# Patient Record
Sex: Female | Born: 1965 | Race: White | Hispanic: No | Marital: Single | Smoking: Current every day smoker
Health system: Southern US, Community
[De-identification: ages and names within clinical notes are randomized; demographics above are authoritative.]

## PROBLEM LIST (undated history)

## (undated) HISTORY — PX: BILATERAL CARPAL TUNNEL RELEASE: SHX6508

---

## 2006-01-04 ENCOUNTER — Emergency Department: Payer: Self-pay | Admitting: Emergency Medicine

## 2006-01-04 IMAGING — CR RIGHT FOOT COMPLETE - 3+ VIEW
1 series · 3 of 3 positions shown · non-contrast
Comparison: none

REASON FOR EXAM: Injury
COMMENTS:

PROCEDURE:     DXR - DXR FOOT RT COMPLETE W/OBLIQUES  - January 04, 2006  [DATE]
RESULT:     Three views reveal no fractures or dislocations. The joint
spaces are intact.

[Series 1: view not recorded · 0.17mm/px · 3 of 3 slices shown]
[im 1/3]
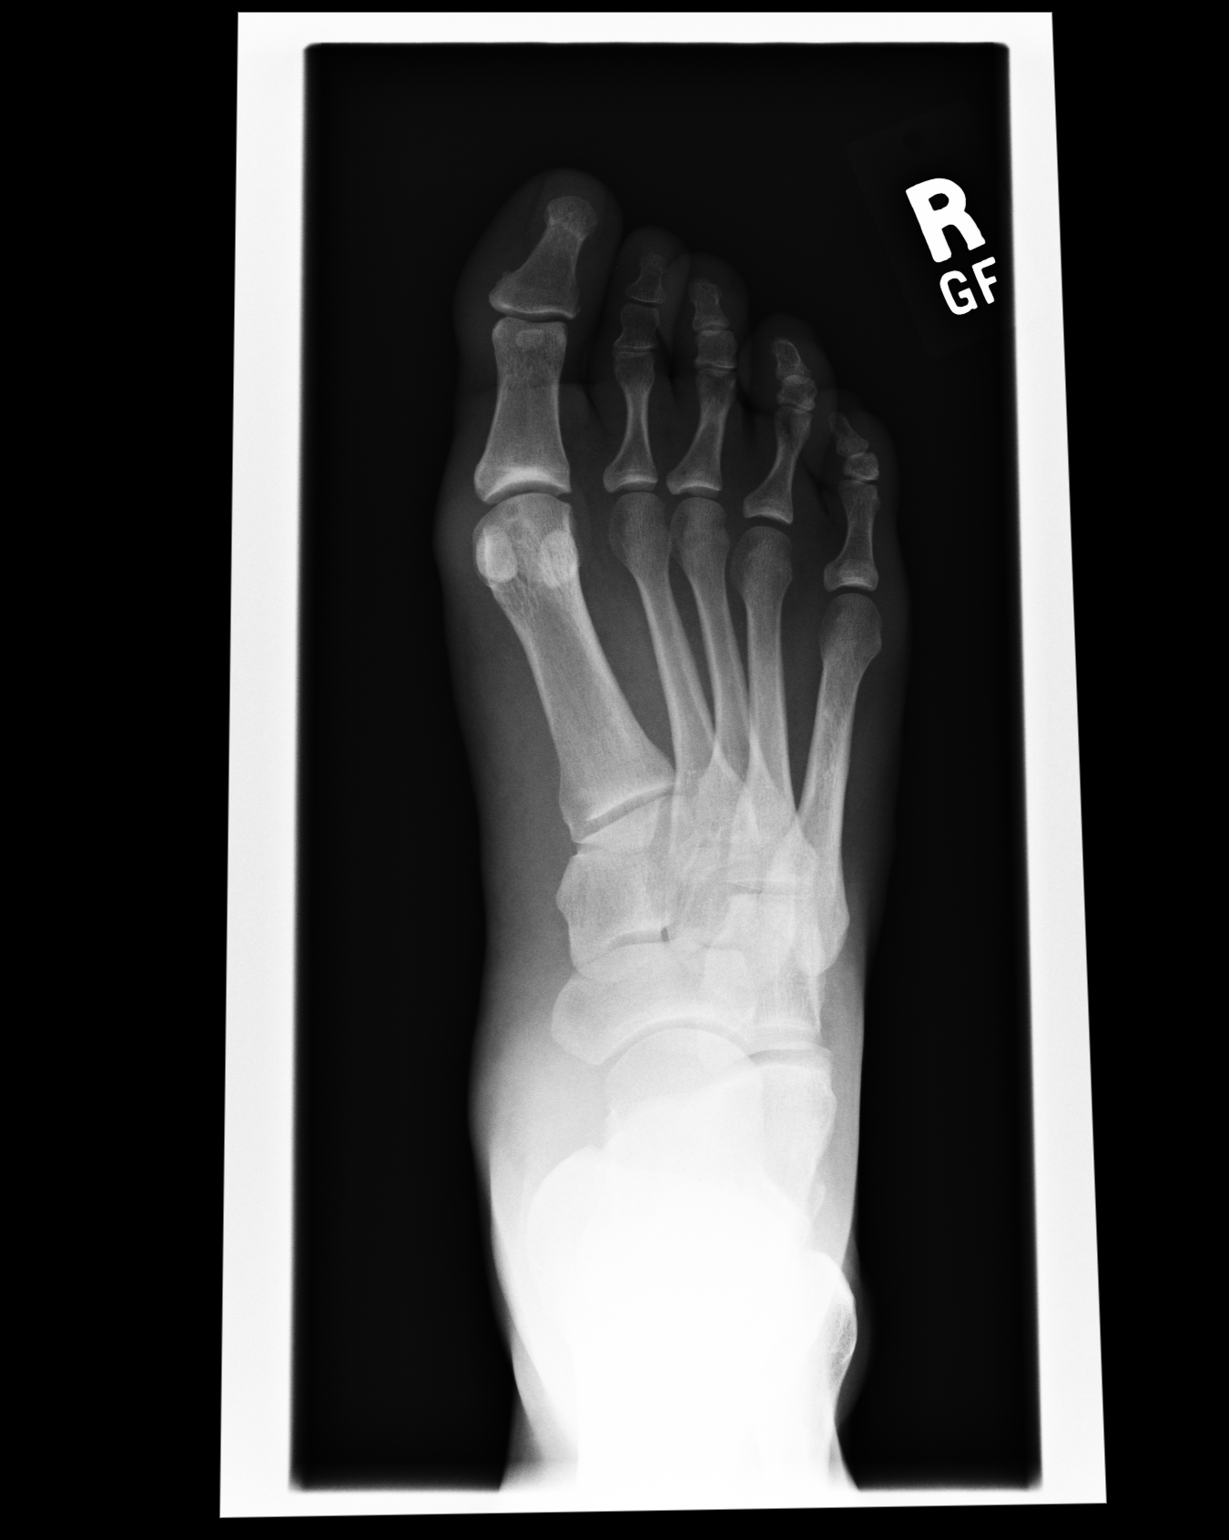
[im 2/3]
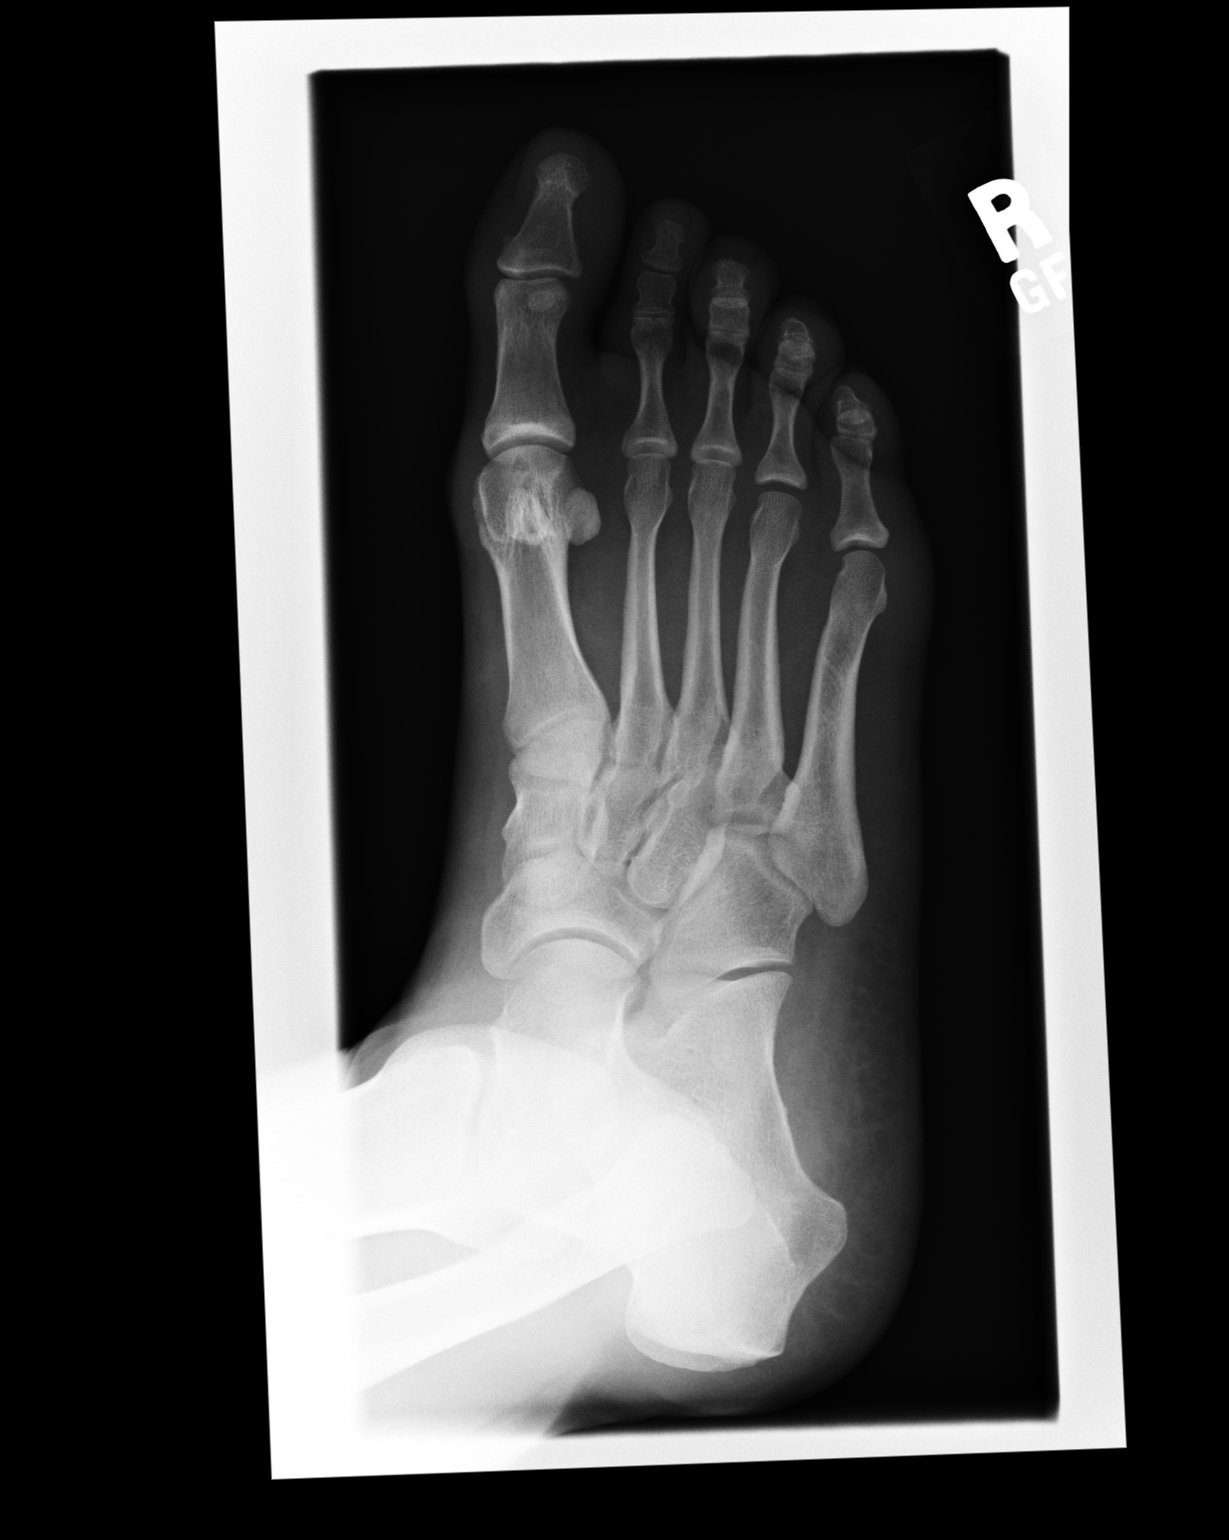
[im 3/3]
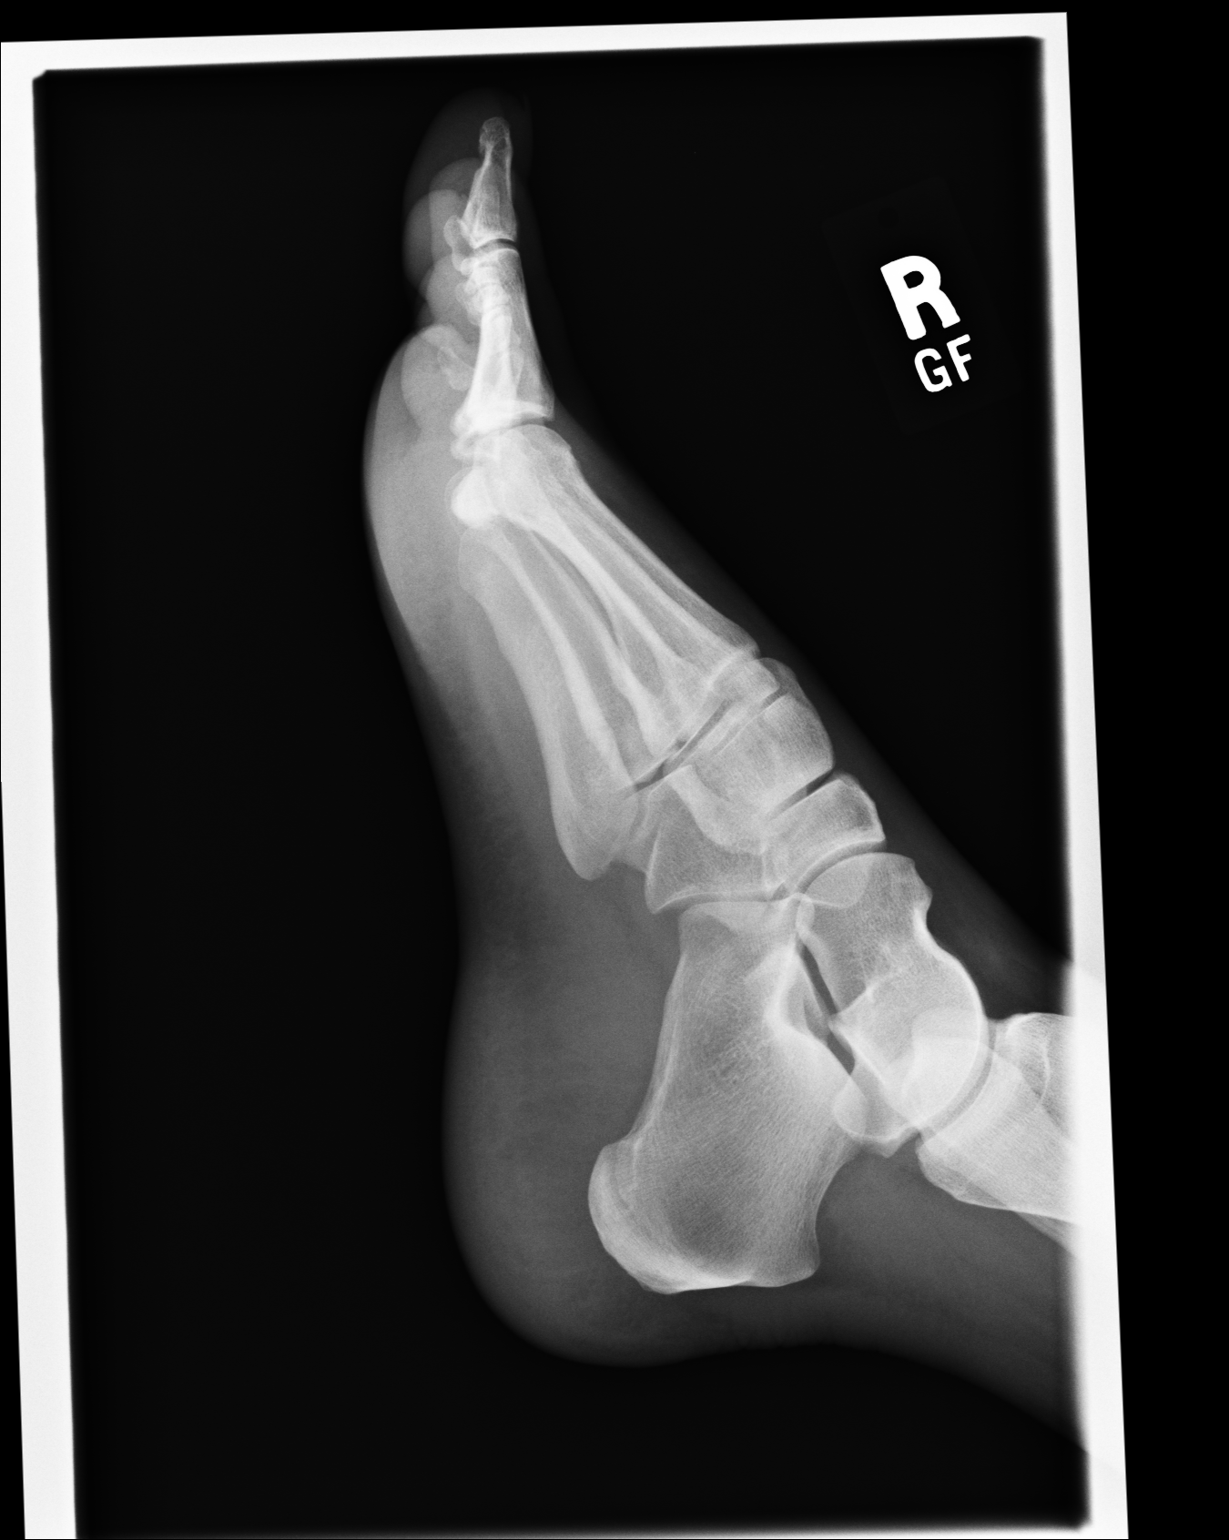

[3 of 3 positions shown; findings below may reference images not displayed]

IMPRESSION: No acute fracture is noted of the RIGHT foot.

## 2010-10-20 ENCOUNTER — Ambulatory Visit: Payer: Self-pay | Admitting: Specialist

## 2010-10-24 ENCOUNTER — Ambulatory Visit: Payer: Self-pay | Admitting: Specialist

## 2012-06-22 ENCOUNTER — Ambulatory Visit: Payer: Self-pay

## 2012-06-22 IMAGING — MG MAM [facility] DIG SCREEN MAM W/CAD
1 series · 7 of 7 positions shown · non-contrast
Comparison: none

REASON FOR EXAM: baseline
COMMENTS:

[R CC · right · 7 of 7 slices shown]
[im 1/7]
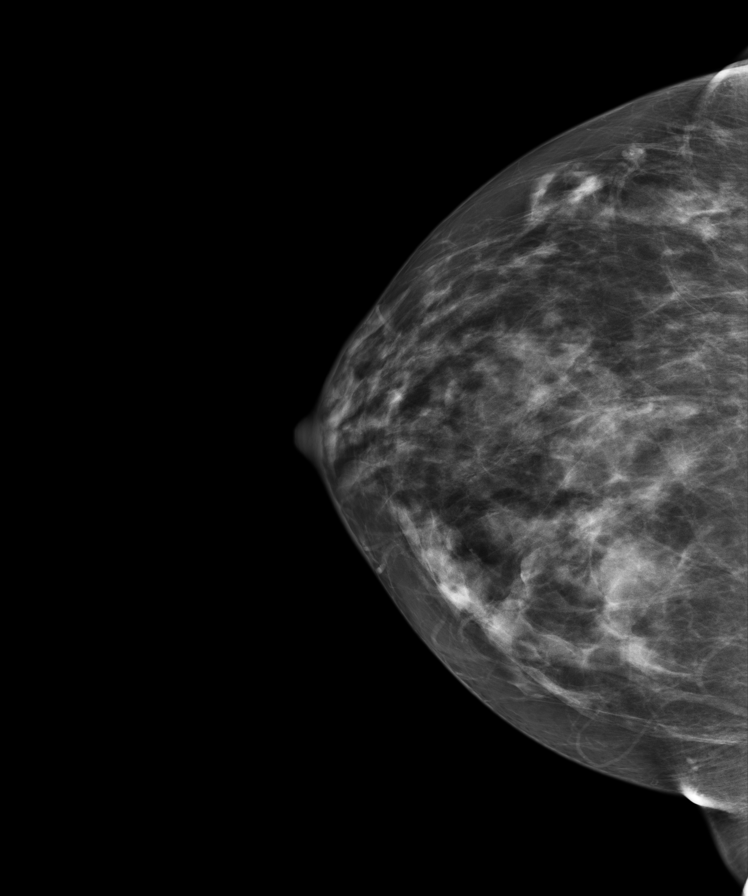
[im 2/7]
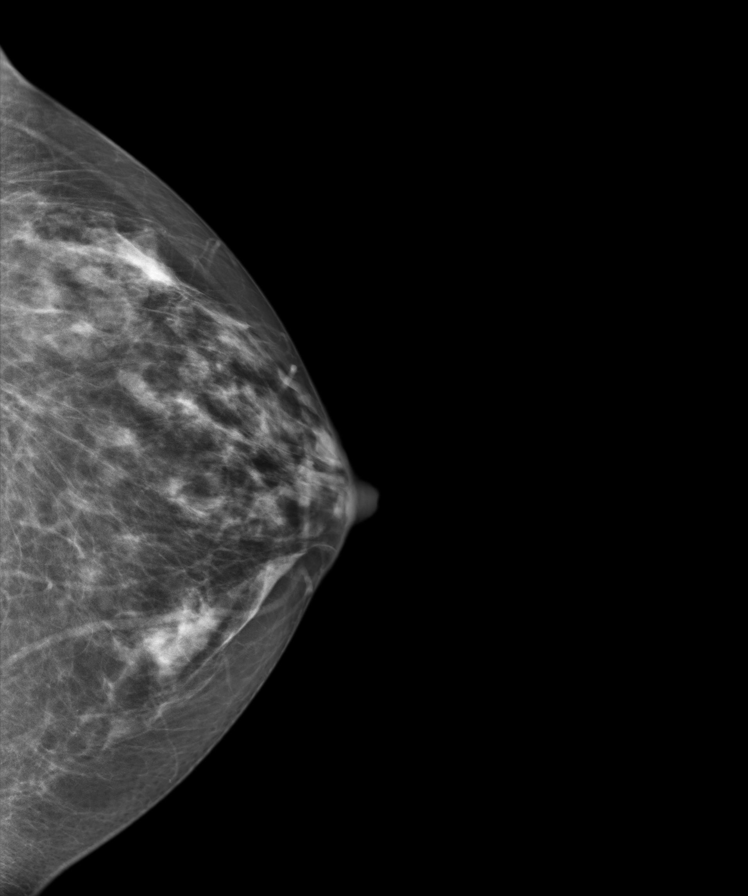
[im 3/7]
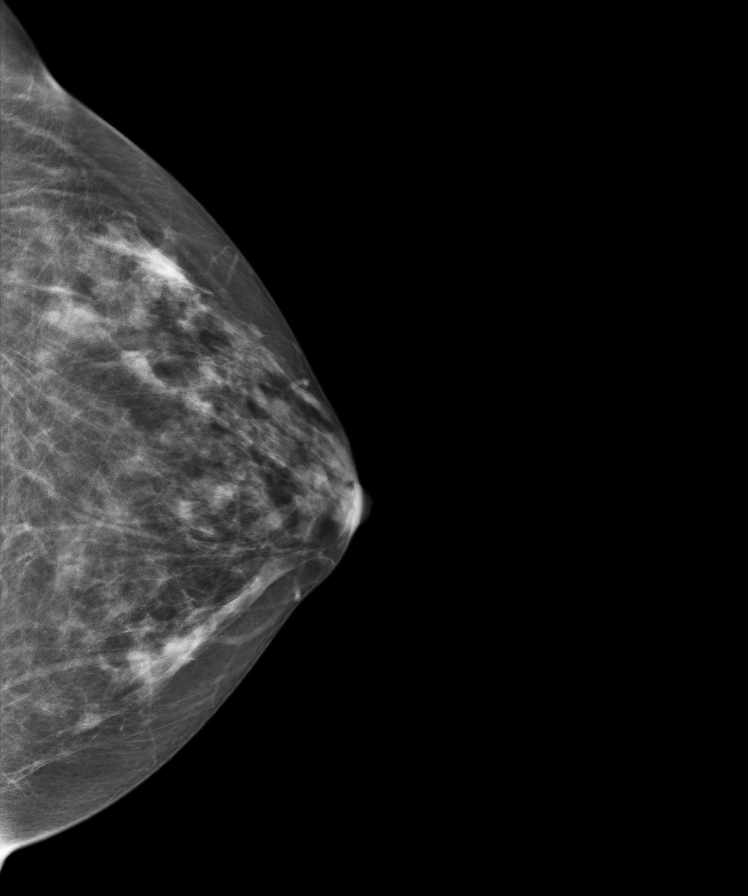
[im 4/7]
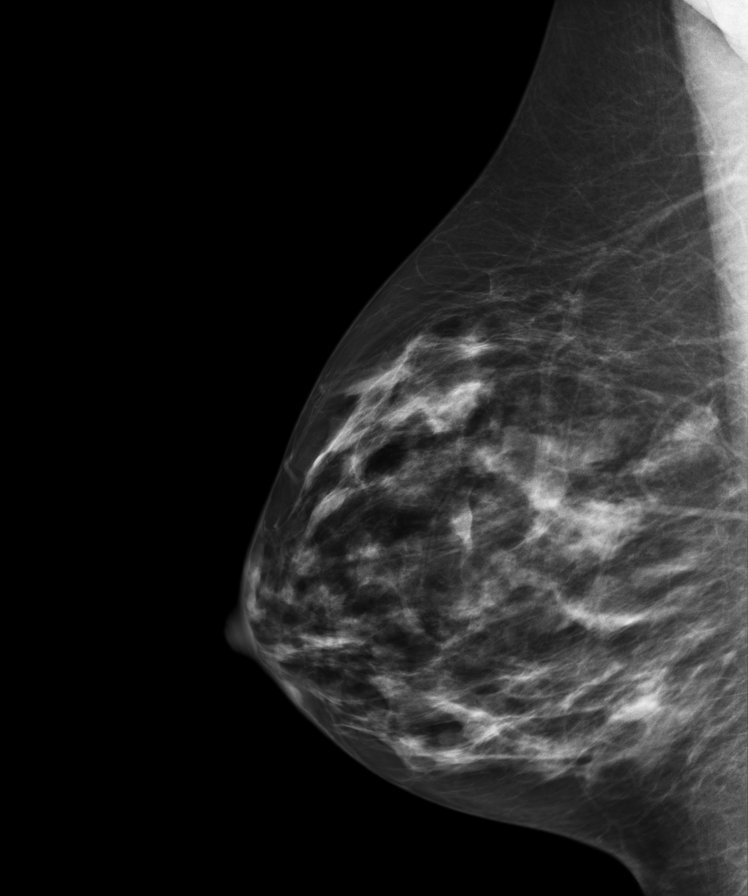
[im 5/7]
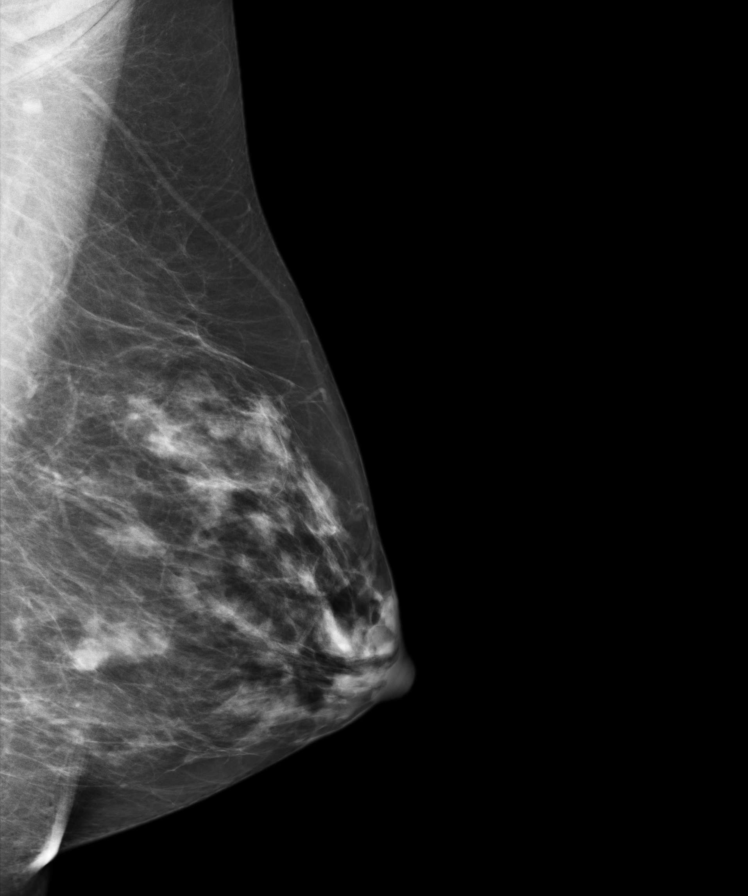
[im 6/7]
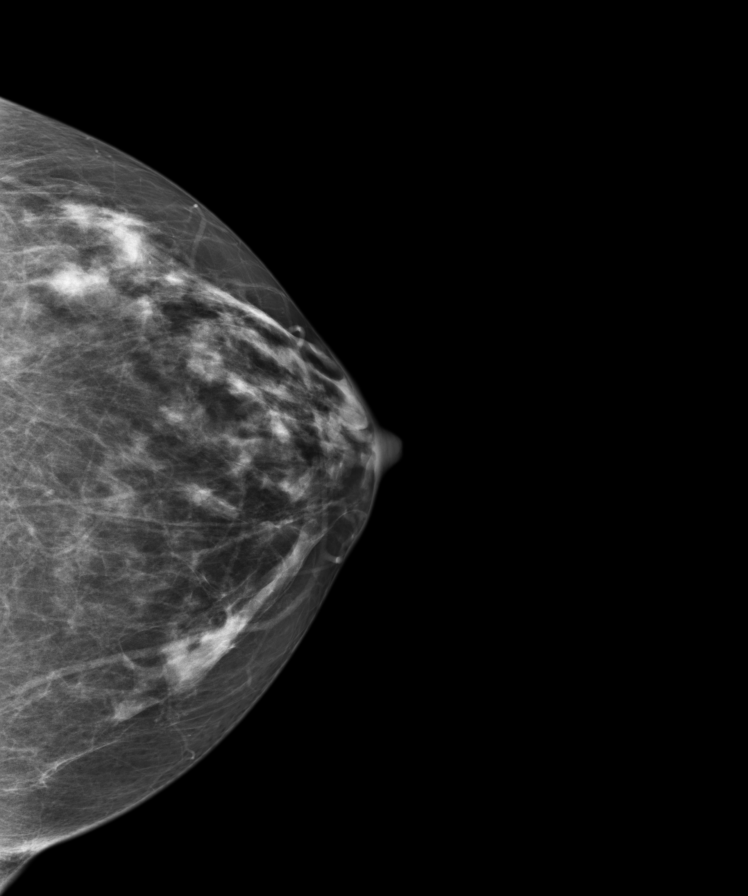
[im 7/7]
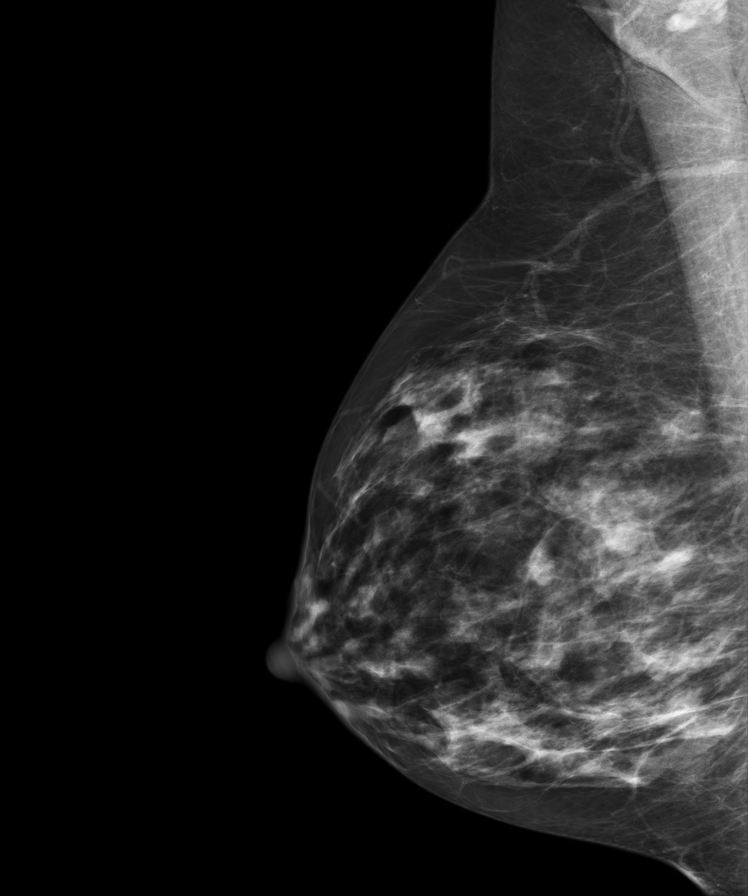

[7 of 7 positions shown; findings below may reference images not displayed]

PROCEDURE:     MAM - MAM [REDACTED] DIG SCREEN MAM W/CAD  - June 22, 2012  [DATE]

RESULT:     This is a baseline exam. There is no previous study for
comparison. The patient has no personal or family history of breast cancer.
The patient denies previous breast surgery.

The breasts exhibit heterogeneously dense parenchymal pattern without
dominant mass or malignant appearing calcification. There is no
architectural distortion.
IMPRESSION: Benign appearing bilateral mammogram. Please continue to
encourage annual mammographic followup and monthly breast self exam.

BREAST COMPOSITION: The breast composition is HETEROGENEOUSLY DENSE
(glandular tissue is 51-75%) This may decrease the sensitivity of
mammography.

BI-RADS: Category 2- Benign Finding

A NEGATIVE MAMMOGRAM REPORT DOES NOT PRECLUDE BIOPSY OR OTHER EVALUATION OF
A CLINICALLY PALPABLE OR OTHERWISE SUSPICIOUS MASS OR LESION. BREAST CANCER
MAY NOT BE DETECTED IN UP TO 10% OF CASES.

Dictation site: One

## 2013-07-05 ENCOUNTER — Ambulatory Visit: Payer: Self-pay

## 2013-07-05 IMAGING — MG MM DIGITAL SCREENING BILAT W/ CAD
2 series · 5 of 5 positions shown · non-contrast
Comparison: Previous Exam(s)

CLINICAL DATA: Screening.

EXAM:
DIGITAL SCREENING BILATERAL MAMMOGRAM WITH CAD

[R CC · right · 4 of 4 slices shown (1 of 2)]
[im 1/4]
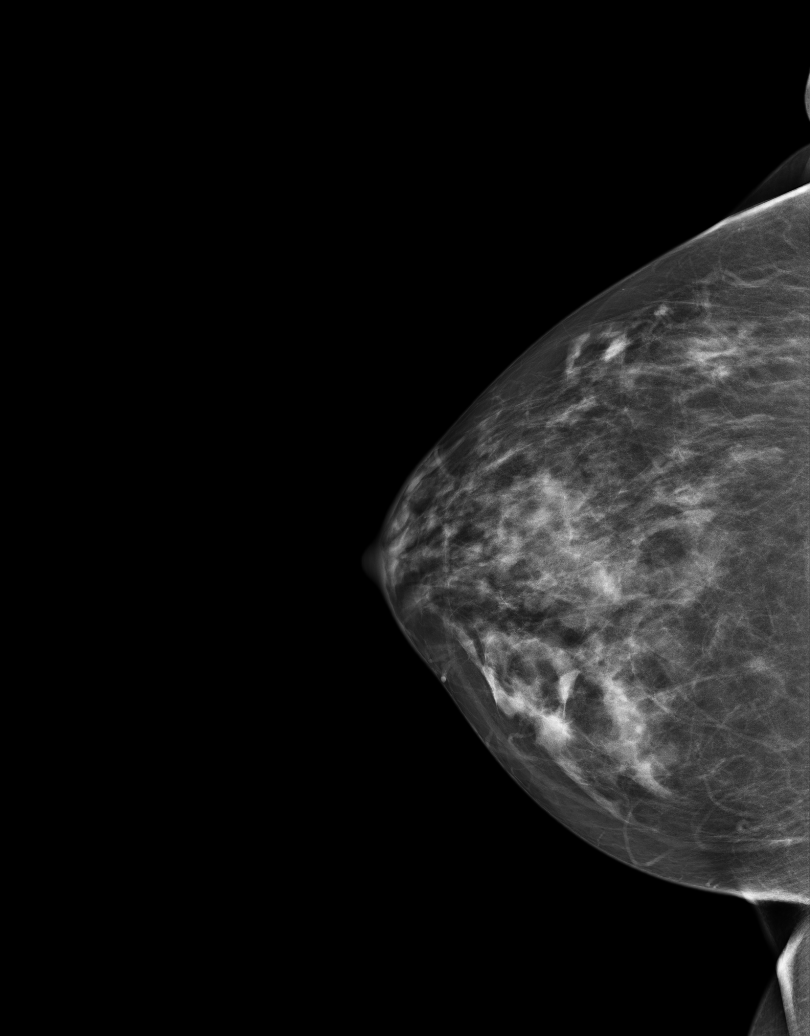
[im 2/4]
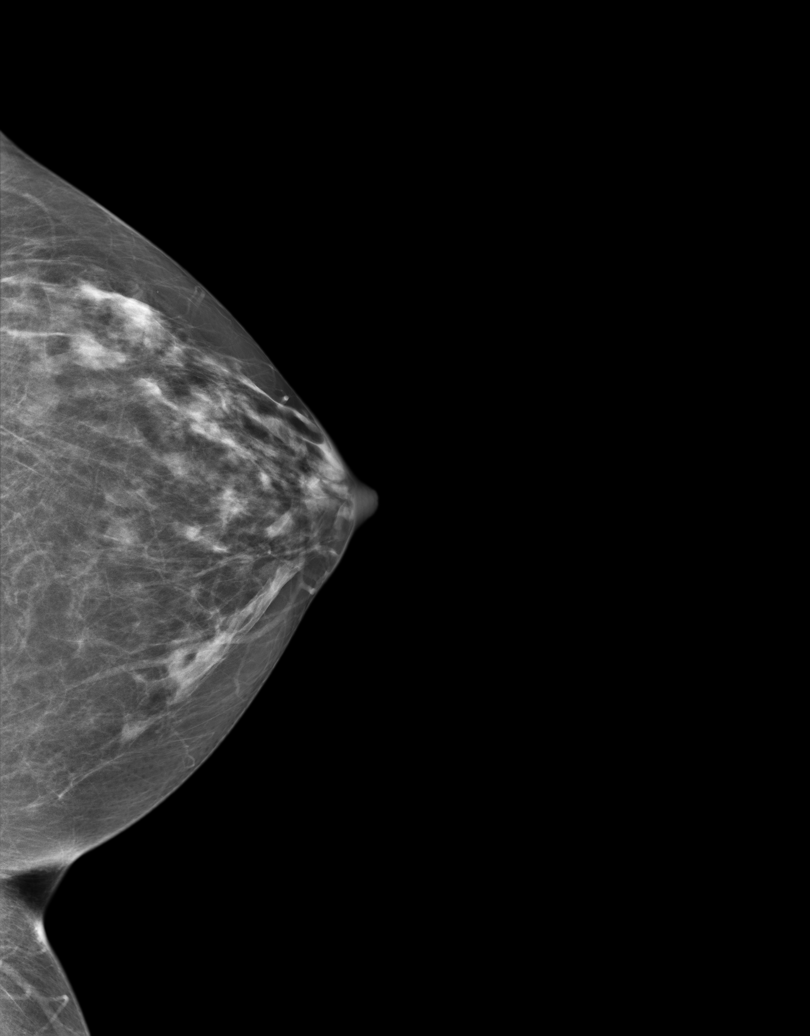
[im 3/4]
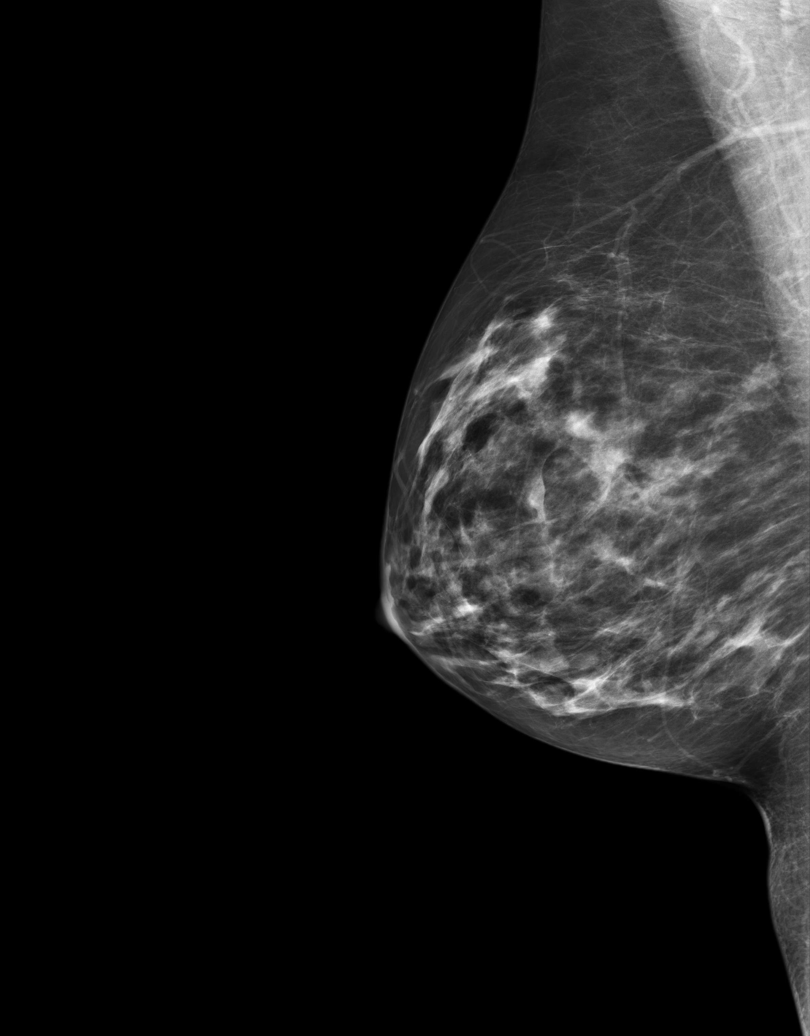
[im 4/4]
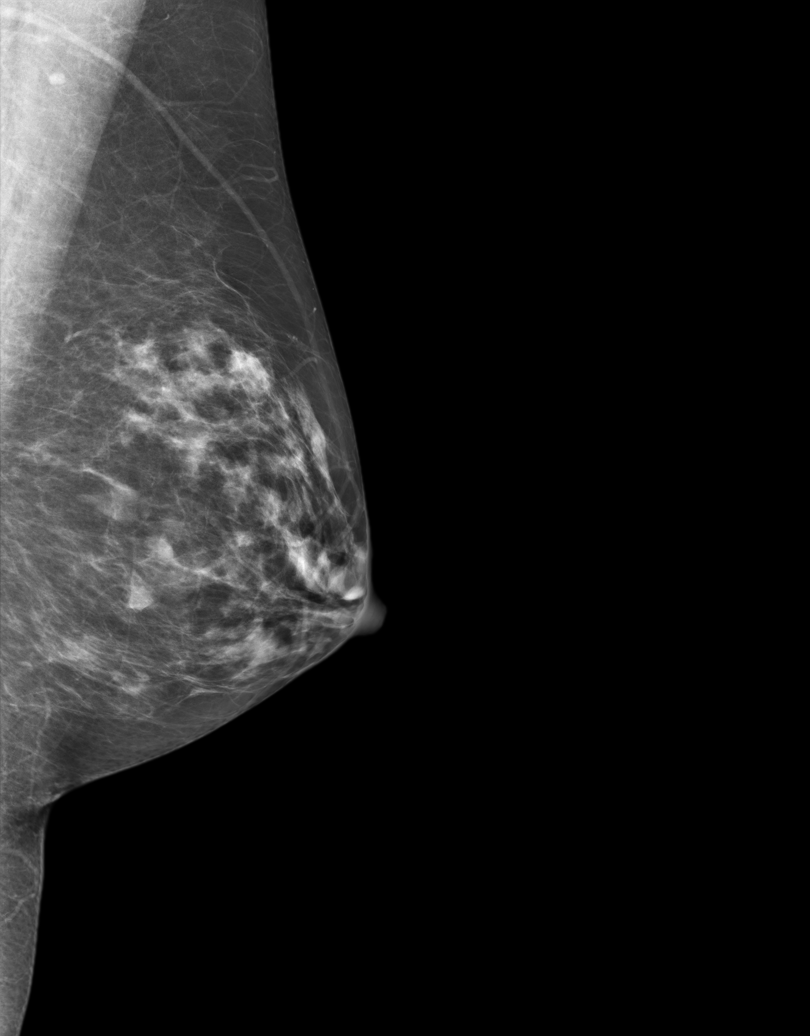

[R CC (2 of 2)]
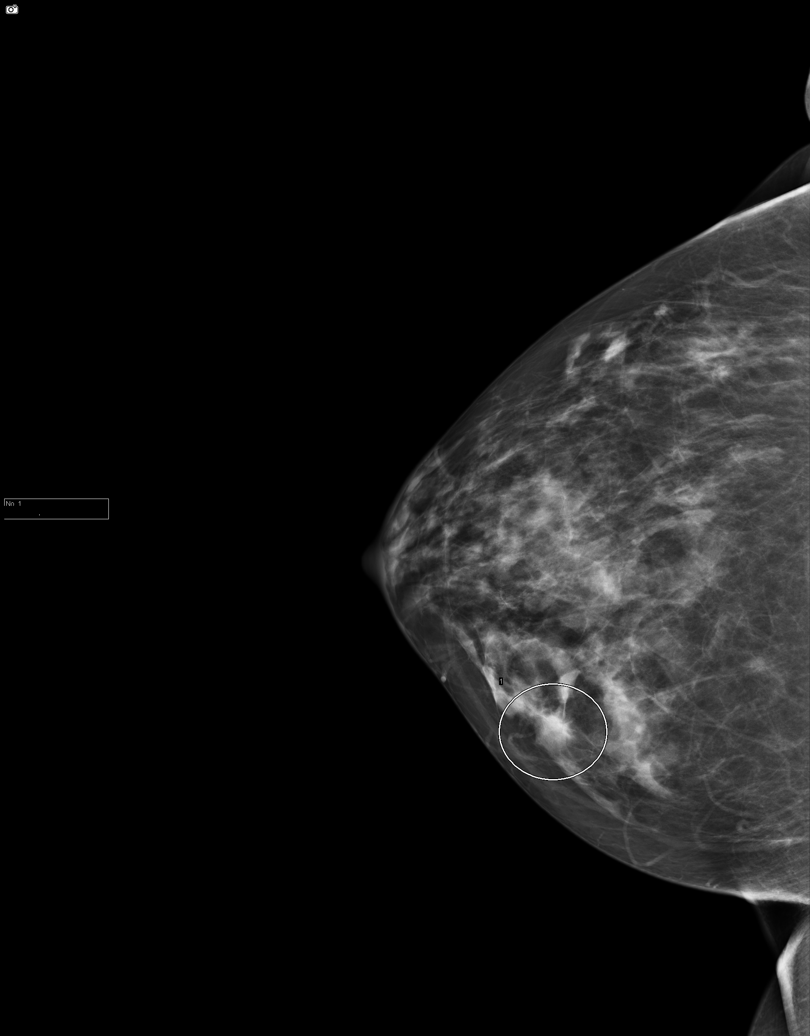

[5 of 5 positions shown; findings below may reference images not displayed]

ACR Breast Density Category c: The breast tissue is heterogeneously
dense, which may obscure small masses.
FINDINGS: In the right breast, possible distortion warrants further evaluation
with spot compression views and possibly ultrasound. In the left
breast, no findings suspicious for malignancy. Images were processed
with CAD.
IMPRESSION: Further evaluation is suggested for possible distortion in the right
breast.

RECOMMENDATION:
Diagnostic mammogram and possibly ultrasound of the right breast.
(Code:[id number])

The patient will be contacted regarding the findings, and additional
imaging will be scheduled.

BI-RADS CATEGORY  0: Incomplete. Need additional imaging evaluation
and/or prior mammograms for comparison.

## 2013-07-17 ENCOUNTER — Ambulatory Visit: Payer: Self-pay

## 2013-07-17 IMAGING — MG MM ADDITIONAL VIEWS AT NO CHARGE
1 series · 4 of 4 positions shown · non-contrast
Comparison: With priors.

CLINICAL DATA: Abnormal right screening mammogram.

EXAM:
DIGITAL DIAGNOSTIC  RIGHT MAMMOGRAM WITH CAD
ULTRASOUND RIGHT BREAST

[R ML · right · 4 of 4 slices shown]
[im 1/4]
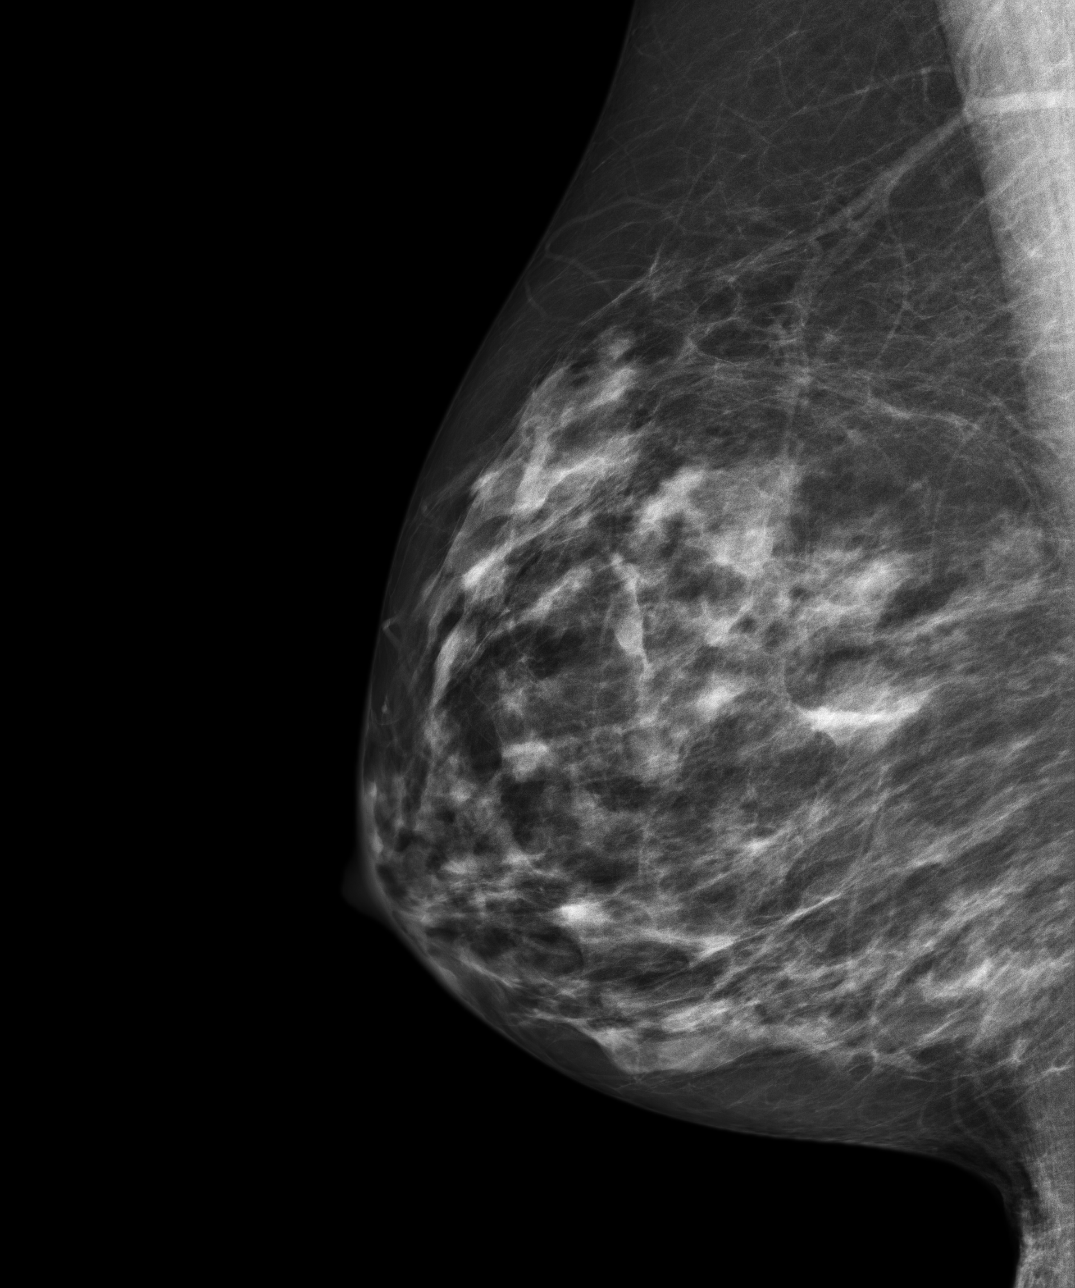
[im 2/4]
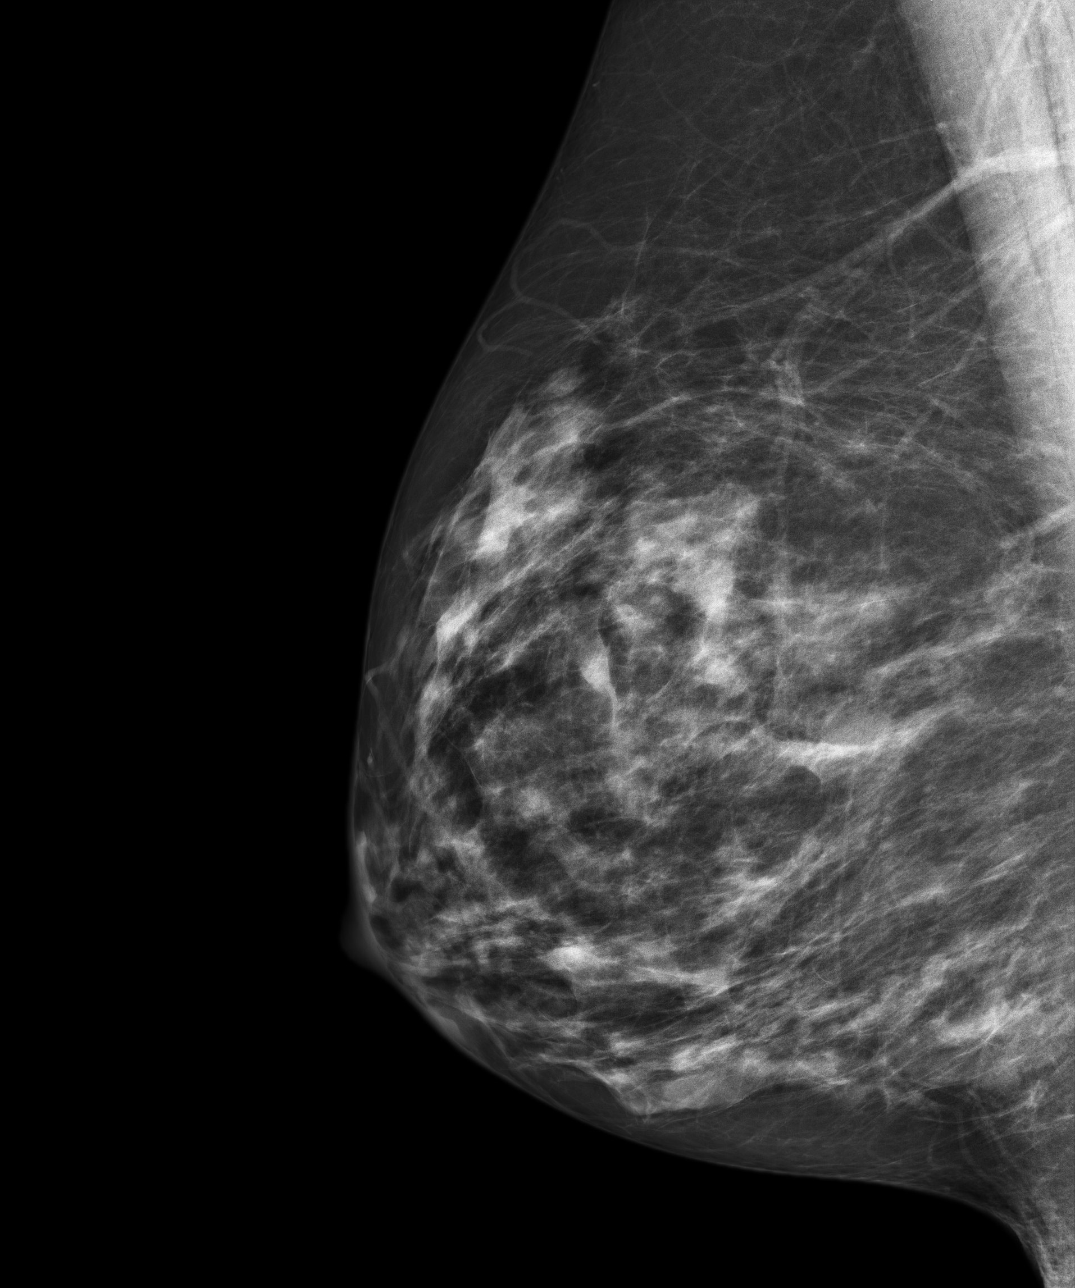
[im 3/4]
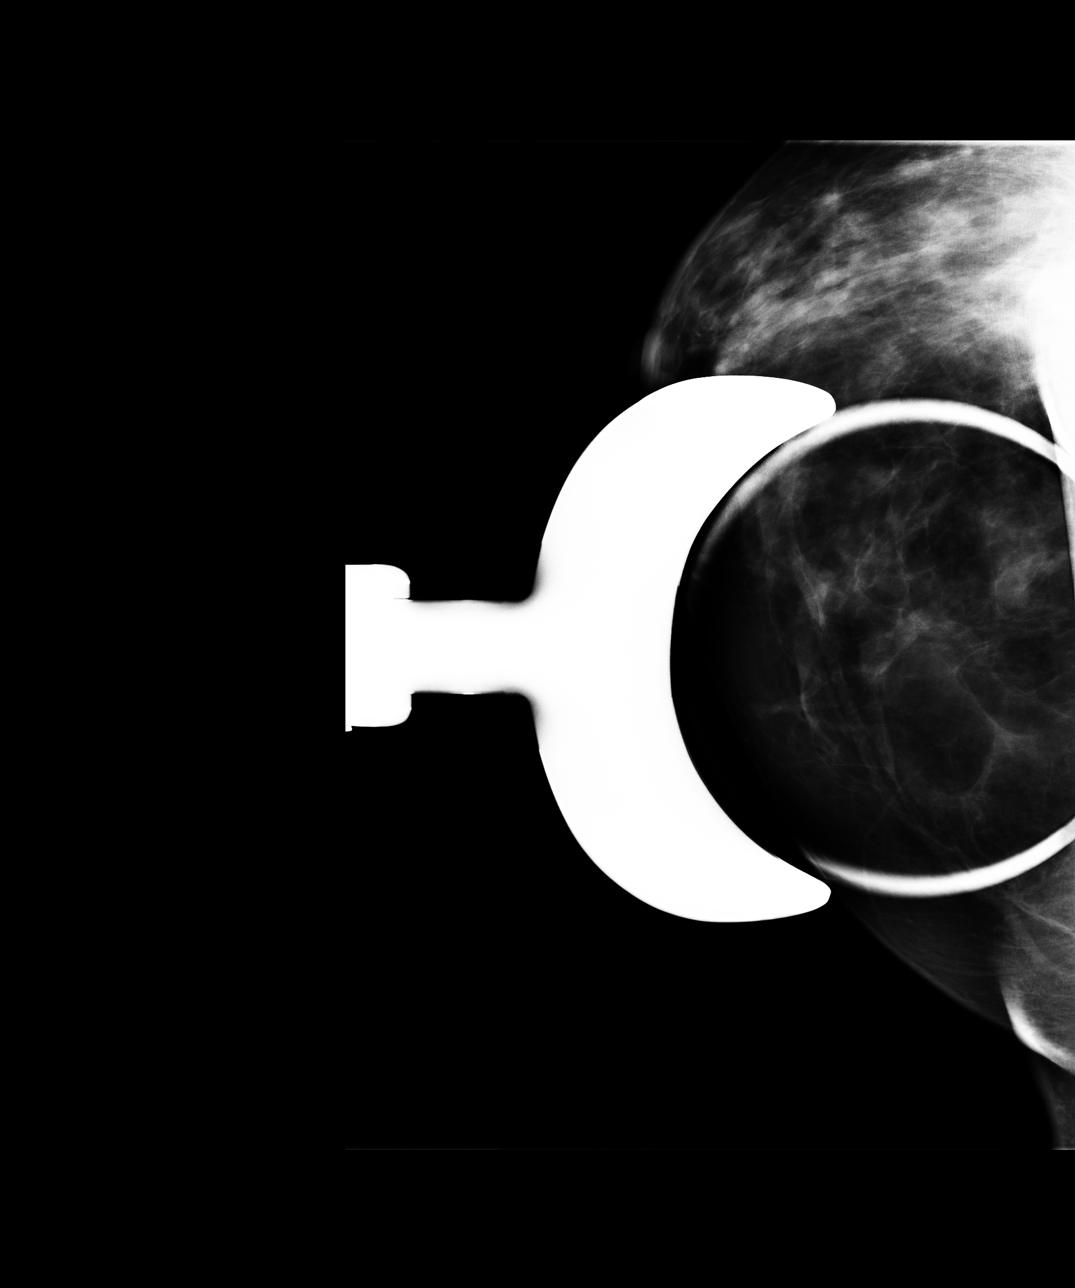
[im 4/4]
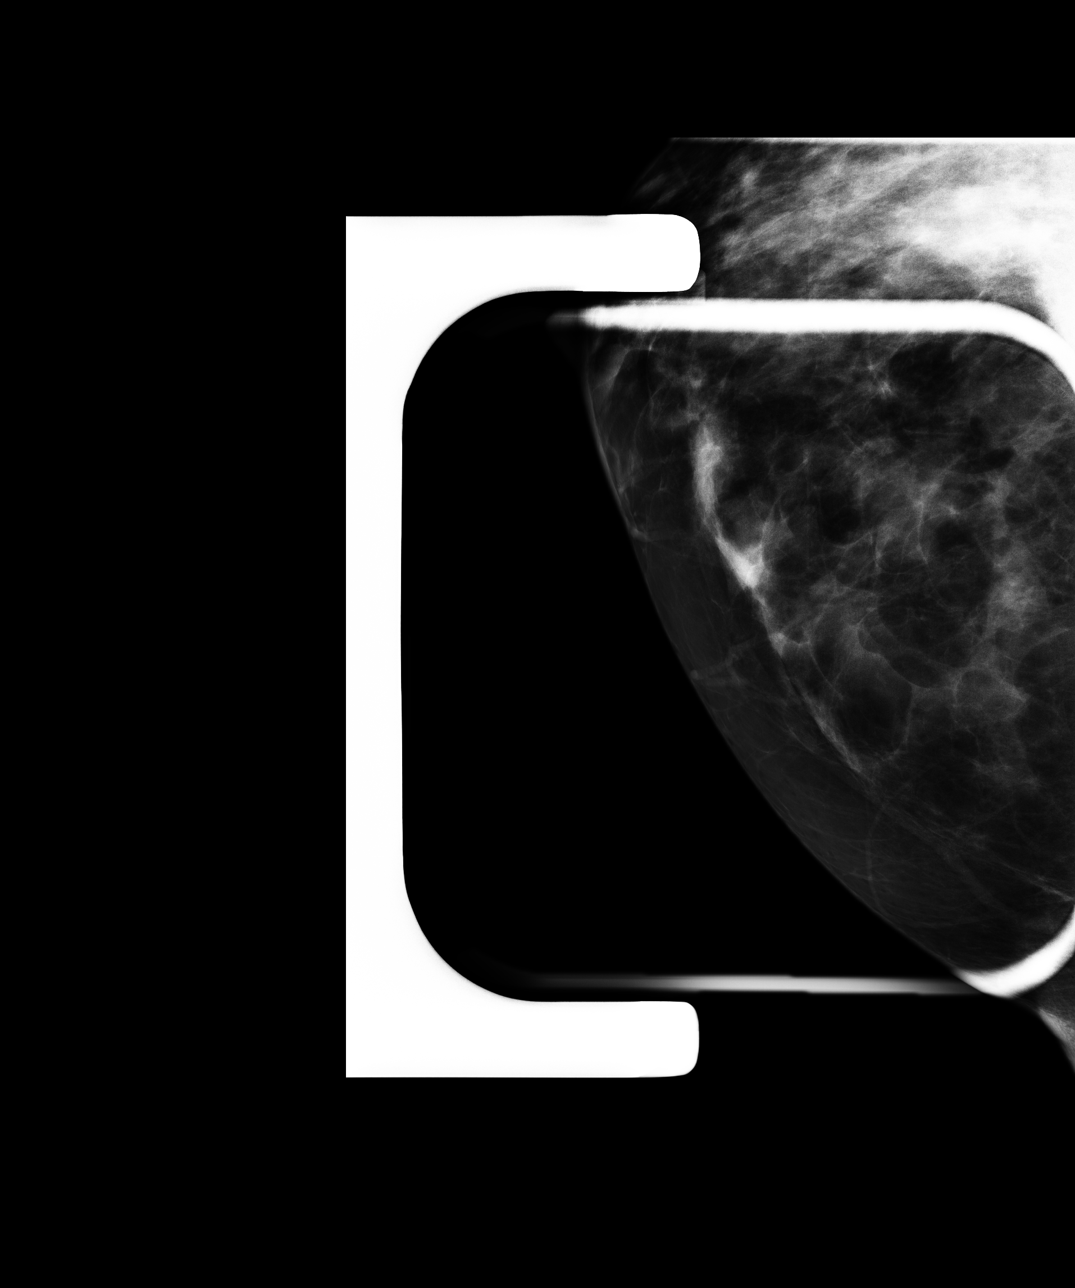

[4 of 4 positions shown; findings below may reference images not displayed]

ACR Breast Density Category c: The breast tissue is heterogeneously
dense, which may obscure small masses.
FINDINGS: Additional imaging of the right breast was performed. No persistent
mass, distortion or malignant type microcalcifications identified.

Mammographic images were processed with CAD.

On physical exam, I do not palpate a mass in the right breast.

Ultrasound is performed, showing normal tissue in the medial aspect
of the right breast.
IMPRESSION: No evidence of malignancy in the right breast.

RECOMMENDATION:
Bilateral screening mammogram in 1 year is recommended.

I have discussed the findings and recommendations with the patient.
Results were also provided in writing at the conclusion of the
visit.

BI-RADS CATEGORY  1: Negative.

## 2017-04-27 ENCOUNTER — Other Ambulatory Visit: Payer: Self-pay | Admitting: Internal Medicine

## 2017-06-16 ENCOUNTER — Ambulatory Visit
Admission: RE | Admit: 2017-06-16 | Discharge: 2017-06-16 | Disposition: A | Discharge status: Home / Self Care | Payer: Self-pay | Source: Ambulatory Visit | Attending: Oncology | Admitting: Oncology

## 2017-06-16 ENCOUNTER — Ambulatory Visit: Payer: Self-pay | Attending: Oncology | Admitting: *Deleted

## 2017-06-16 ENCOUNTER — Encounter: Payer: Self-pay | Admitting: *Deleted

## 2017-06-16 VITALS — BP 108/70 | HR 68 | Temp 97.8°F | Ht 64.0 in | Wt 138.0 lb

## 2017-06-16 DIAGNOSIS — Z0000 Encounter for general adult medical examination without abnormal findings: Secondary | ICD-10-CM

## 2017-06-16 IMAGING — MG MM DIGITAL SCREENING BILAT W/ TOMO W/ CAD
8 of 12 series · 8 of 28 positions shown · non-contrast
Comparison: Previous exam(s).

CLINICAL DATA: Screening.

EXAM:
2D DIGITAL SCREENING BILATERAL MAMMOGRAM WITH 3D TOMO WITH CAD

[R MLO synth-2D]
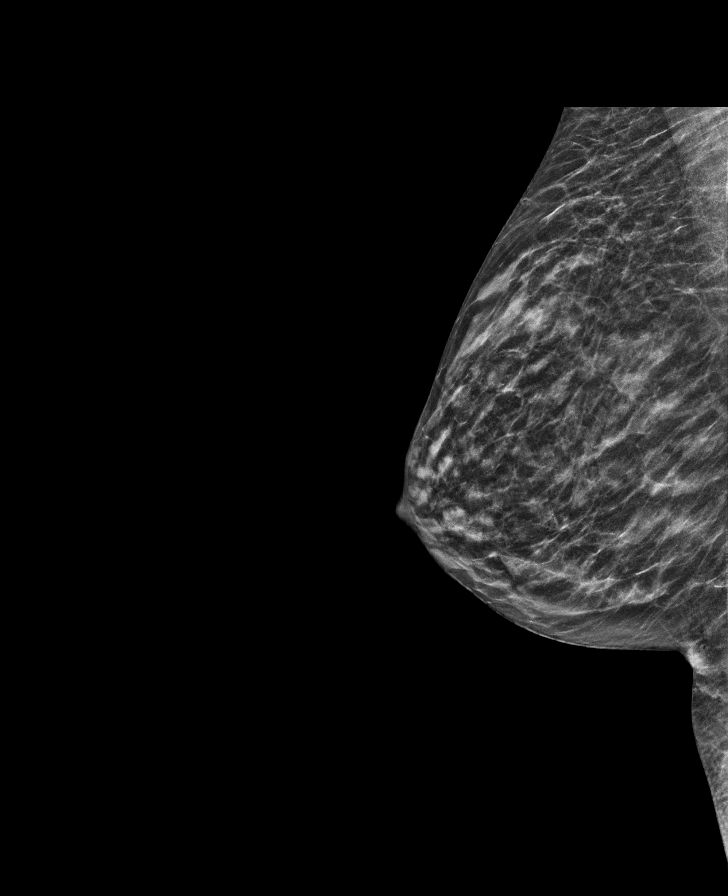

[R CC synth-2D]
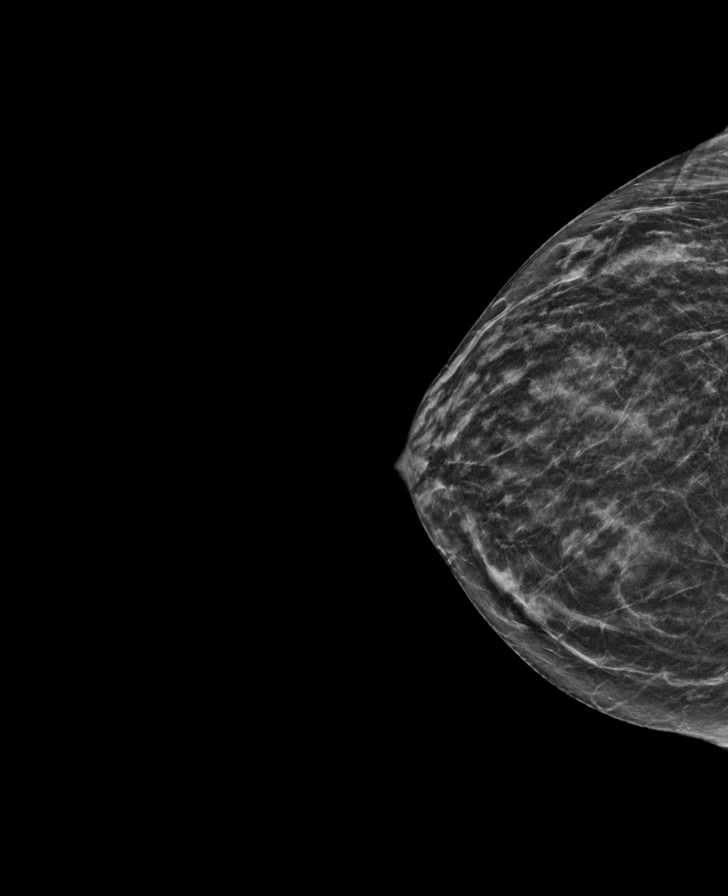

[L MLO]
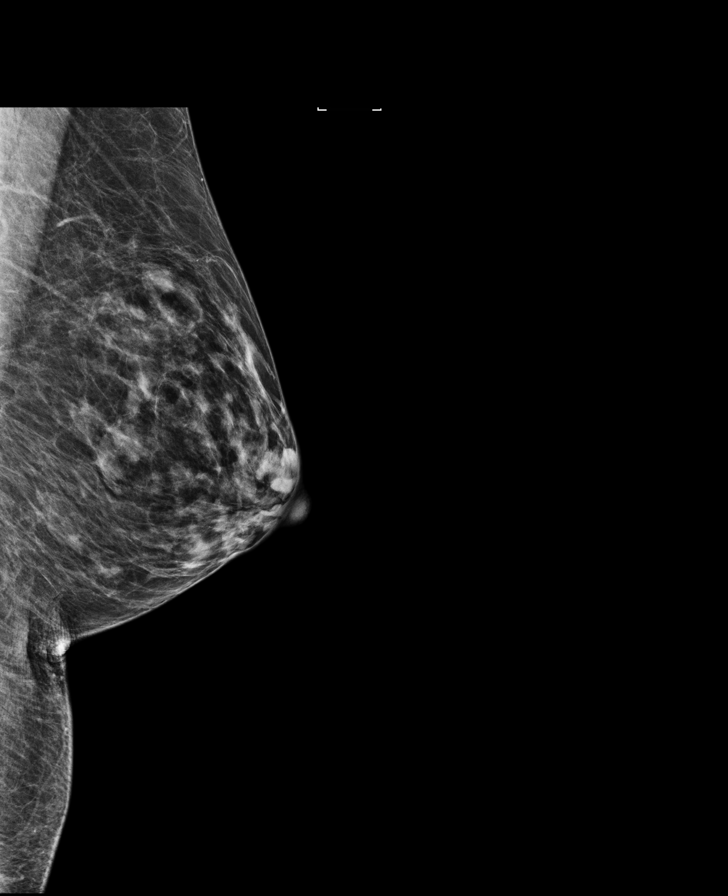

[R MLO]
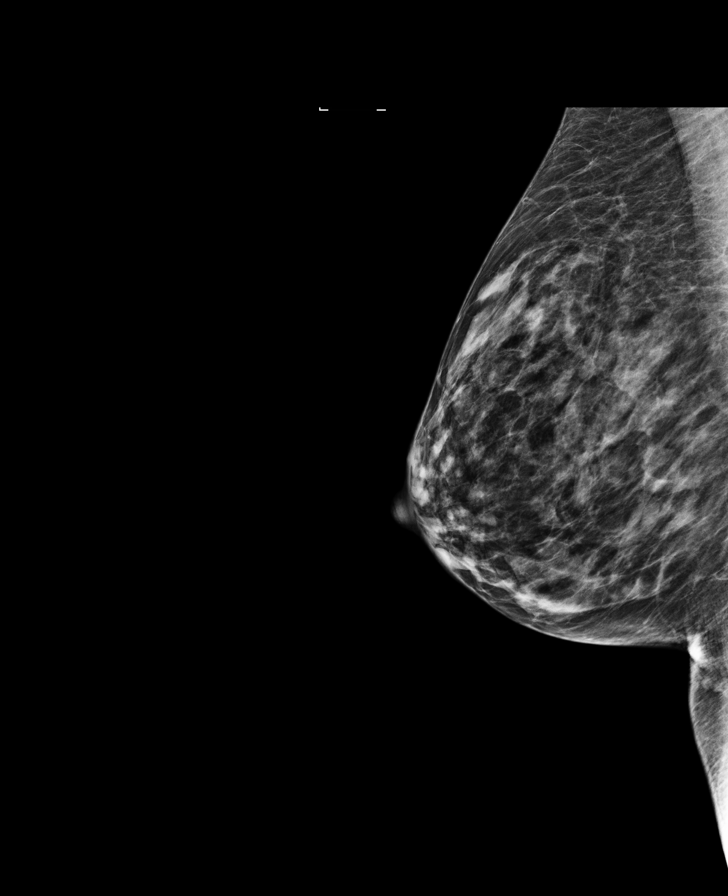

[L CC]
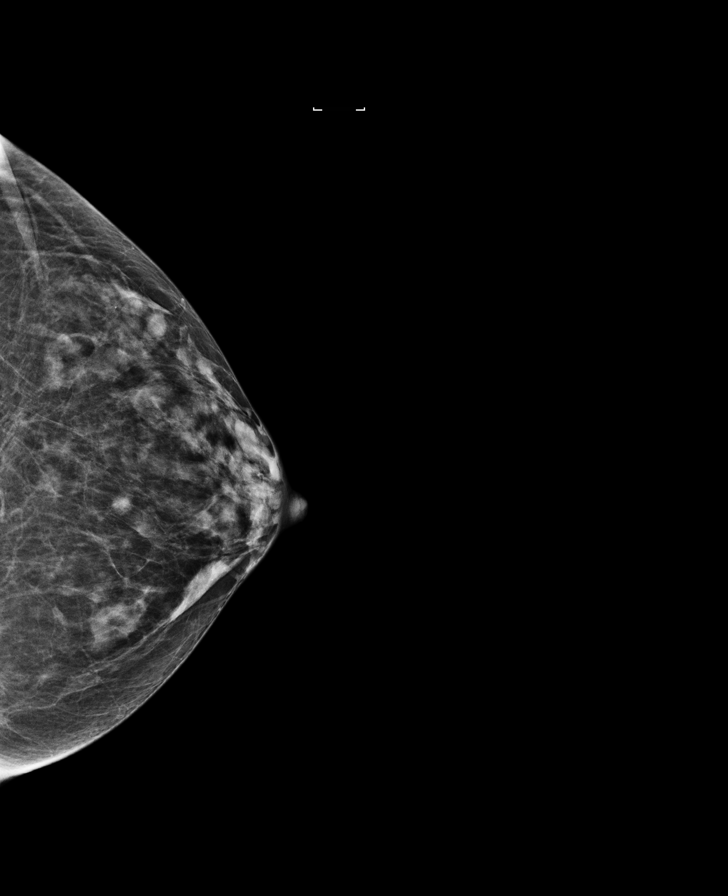

[R CC]
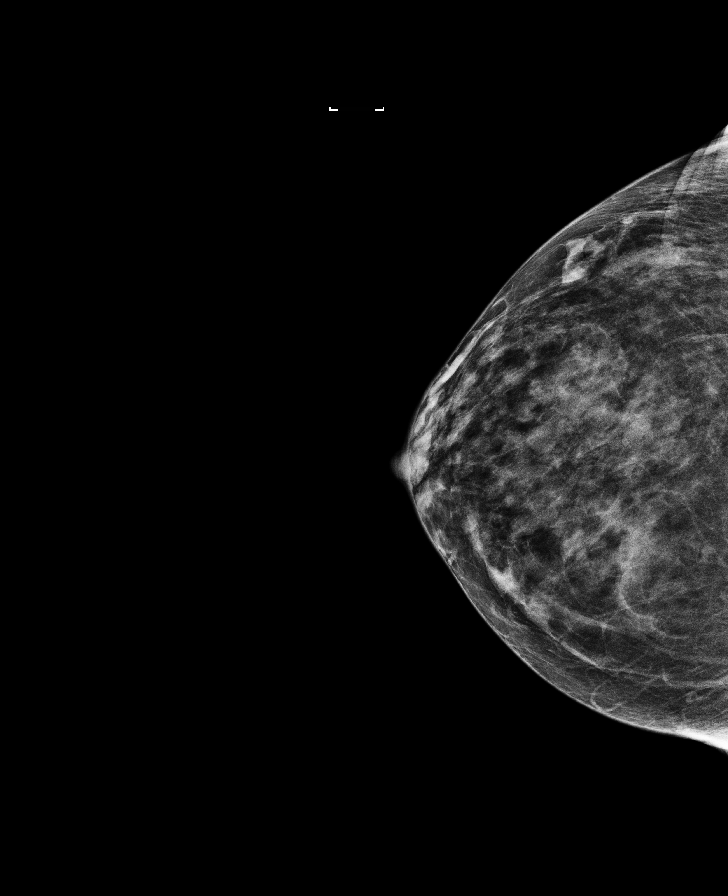

[L CC synth-2D]
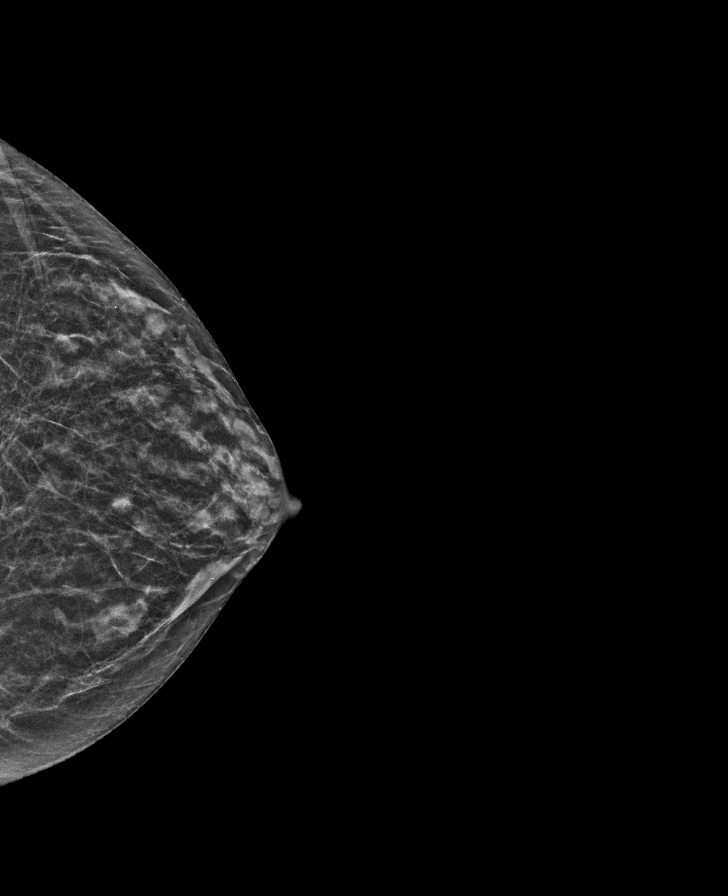

[L MLO synth-2D]
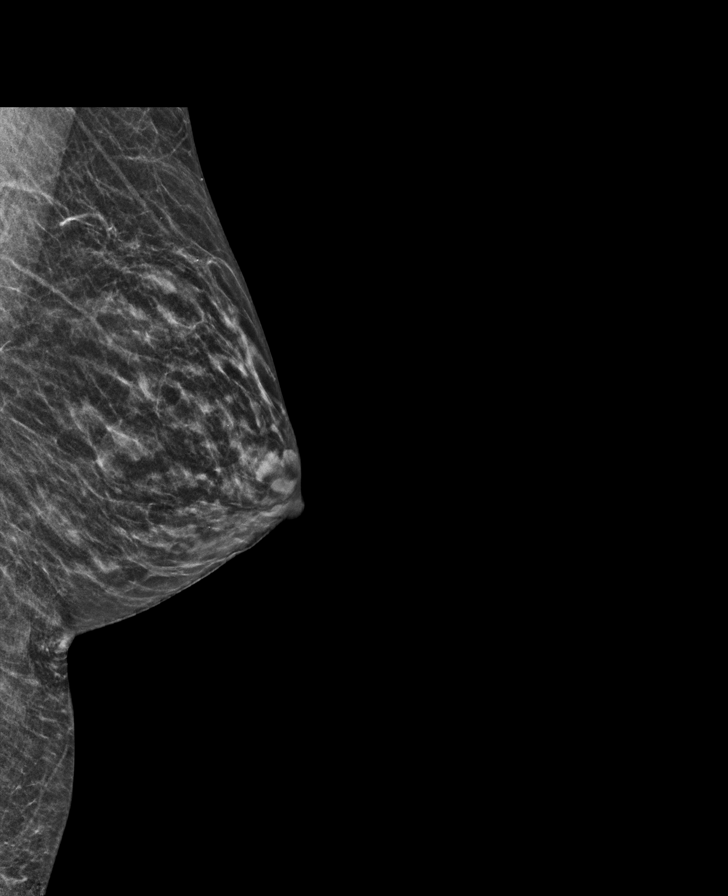

[8 of 28 positions shown; findings below may reference images not displayed]

ACR Breast Density Category c: The breast tissue is heterogeneously
dense, which may obscure small masses.
FINDINGS: There are no findings suspicious for malignancy. Images were
processed with CAD.
IMPRESSION: No mammographic evidence of malignancy. A result letter of this
screening mammogram will be mailed directly to the patient.

RECOMMENDATION:
Screening mammogram in one year. (Code:[4B])

BI-RADS CATEGORY  1: Negative.

## 2017-06-16 NOTE — Progress Notes (Signed)
Subjective:     Patient ID: Lindsay Potts, female   DOB: [birth year], 52 y.o.   MRN: [id number]  HPI   Review of Systems     Objective:   Physical Exam  Pulmonary/Chest: Right breast exhibits no inverted nipple, no mass, no nipple discharge, no skin change and no tenderness. Left breast exhibits no inverted nipple, no mass, no nipple discharge, no skin change and no tenderness. Breasts are symmetrical.  Abdominal: There is no splenomegaly or hepatomegaly.  Genitourinary: There is no rash, tenderness, lesion or injury on the right labia. There is no rash, tenderness, lesion or injury on the left labia. Cervix exhibits no motion tenderness, no discharge and no friability. Right adnexum displays no mass, no tenderness and no fullness. Left adnexum displays no mass and no tenderness. No erythema, tenderness or bleeding in the vagina. No foreign body in the vagina. No signs of injury around the vagina. No vaginal discharge found.    Genitourinary Comments: Large rectocele noted on exam       Assessment:     52 year [facility] White [facility] pap and [facility].  Clinical breast exam unremarkable.  Taught self breast awareness.  Last pap per patient was in 2015.  There are no results for review today.  Specimen collected for pap smear with some difficulty.  There is a large rectocele noted and the cervix is very anterior and at the 11:00 position making it difficult to exam.  Patient states she has had some rectal bleeding and wondered if was from her hemorrhoids.  Encouraged her to follow-up with her primary care provider, Dr. Maryellen PileEason.  There is no blood on digital rectal exam.   Patient has been screened for eligibility.  She does not have any insurance, Medicare or Medicaid.  She also meets financial eligibility.  Hand-out given on the Affordable Care Act.    Plan:     Screening mammogram ordered.  Specimen for pap smear sent to the lab.  Will follow-up per  [facility] protocol.

## 2017-06-16 NOTE — Patient Instructions (Signed)
HPV Test The human papillomavirus (HPV) test is used to look for high-risk types of HPV infection. HPV is a group of about 100 viruses. Many of these viruses cause growths on, in, or around the genitals. Most HPV viruses cause infections that usually go away without treatment. However, HPV types 6, 11, 16, and 18 are considered high-risk types of HPV that can increase your risk of cancer of the cervix or anus if the infection is left untreated. An HPV test identifies the DNA (genetic) strands of the HPV infection, so it is also referred to as the HPV DNA test. Although HPV is found in both males and females, the HPV test is only used to screen for increased cancer risk in females:  With an abnormal Pap test.  After treatment of an abnormal Pap test.  Between the ages of 30 and 65.  After treatment of a high-risk HPV infection.  The HPV test may be done at the same time as a pelvic exam and Pap test in females over the age of 30. Both the HPV test and Pap test require a sample of cells from the cervix. How do I prepare for this test?  Do not douche or take a bath for 24-48 hours before the test or as directed by your health care provider.  Do not have sex for 24-48 hours before the test or as directed by your health care provider.  You may be asked to reschedule the test if you are menstruating.  You will be asked to urinate before the test. What do the results mean? It is your responsibility to obtain your test results. Ask the lab or department performing the test when and how you will get your results. Talk with your health care provider if you have any questions about your results. Your result will be negative or positive. Meaning of Negative Test Results A negative HPV test result means that no HPV was found, and it is very likely that you do not have HPV. Meaning of Positive Test Results A positive HPV test result indicates that you have HPV.  If your test result shows the presence  of any high-risk HPV strains, you may have an increased risk of developing cancer of the cervix or anus if the infection is left untreated.  If any low-risk HPV strains are found, you are not likely to have an increased risk of cancer.  Discuss your test results with your health care provider. He or she will use the results to make a diagnosis and determine a treatment plan that is right for you. Talk with your health care provider to discuss your results, treatment options, and if necessary, the need for more tests. Talk with your health care provider if you have any questions about your results. This information is not intended to replace advice given to you by your health care provider. Make sure you discuss any questions you have with your health care provider. Document Released: 06/26/2004 Document Revised: 02/05/2016 Document Reviewed: 10/17/2013 Elsevier Interactive Patient Education  2018 [employer]vier Inc.  

## 2017-06-17 LAB — PAP LB AND HPV HIGH-RISK
HPV, HIGH-RISK: NEGATIVE
PAP Smear Comment: 0

## 2017-06-18 ENCOUNTER — Encounter: Payer: Self-pay | Admitting: *Deleted

## 2017-06-18 NOTE — Progress Notes (Signed)
Letter mailed to inform patient of her normal mammogram and pap smear.  Next mammo due in one year and pap smear due in 5 years.

## 2020-01-03 ENCOUNTER — Ambulatory Visit: Payer: Self-pay

## 2020-01-10 ENCOUNTER — Ambulatory Visit
Admission: RE | Admit: 2020-01-10 | Discharge: 2020-01-10 | Disposition: A | Discharge status: Home / Self Care | Payer: Self-pay | Source: Ambulatory Visit | Attending: Oncology | Admitting: Oncology

## 2020-01-10 ENCOUNTER — Ambulatory Visit: Payer: Self-pay | Attending: Oncology

## 2020-01-10 ENCOUNTER — Other Ambulatory Visit: Payer: Self-pay

## 2020-01-10 VITALS — BP 125/87 | HR 64 | Temp 97.9°F | Ht 64.5 in | Wt 132.4 lb

## 2020-01-10 DIAGNOSIS — Z0000 Encounter for general adult medical examination without abnormal findings: Secondary | ICD-10-CM | POA: Insufficient documentation

## 2020-01-10 IMAGING — MG DIGITAL SCREENING BILAT W/ TOMO W/ CAD
8 series · 8 of 24 positions shown · non-contrast
Comparison: Previous exam(s).

CLINICAL DATA: Screening.

EXAM:
DIGITAL SCREENING BILATERAL MAMMOGRAM WITH TOMO AND CAD

[L CC synth-2D]
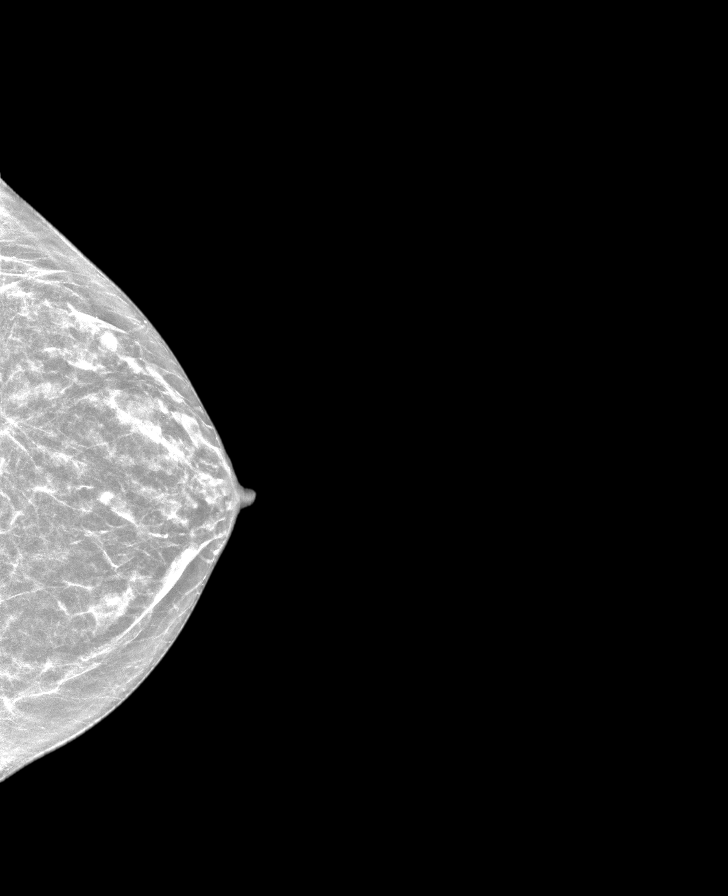

[R MLO synth-2D]
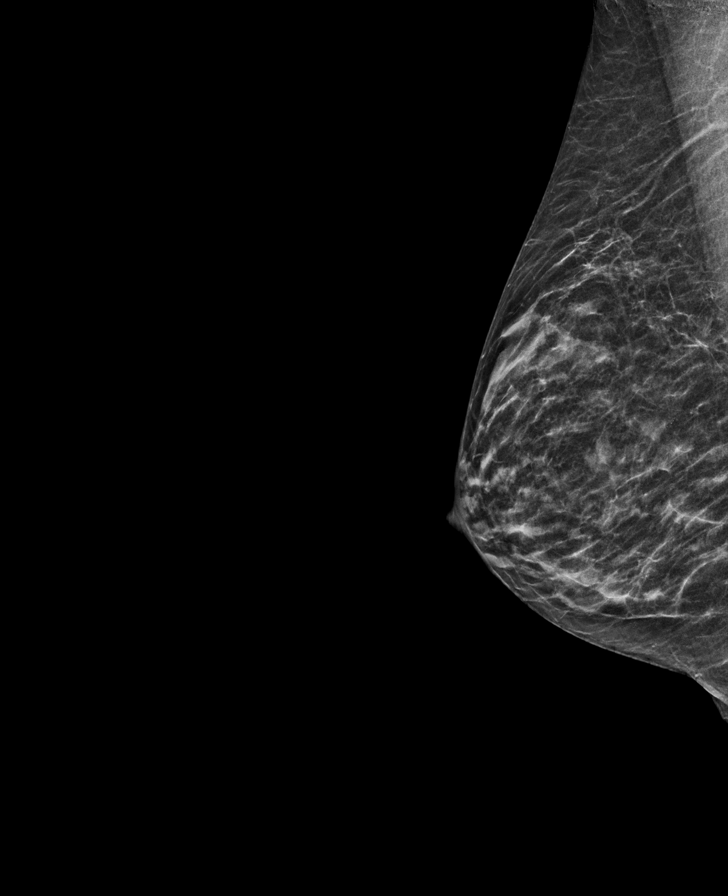

[R CC synth-2D]
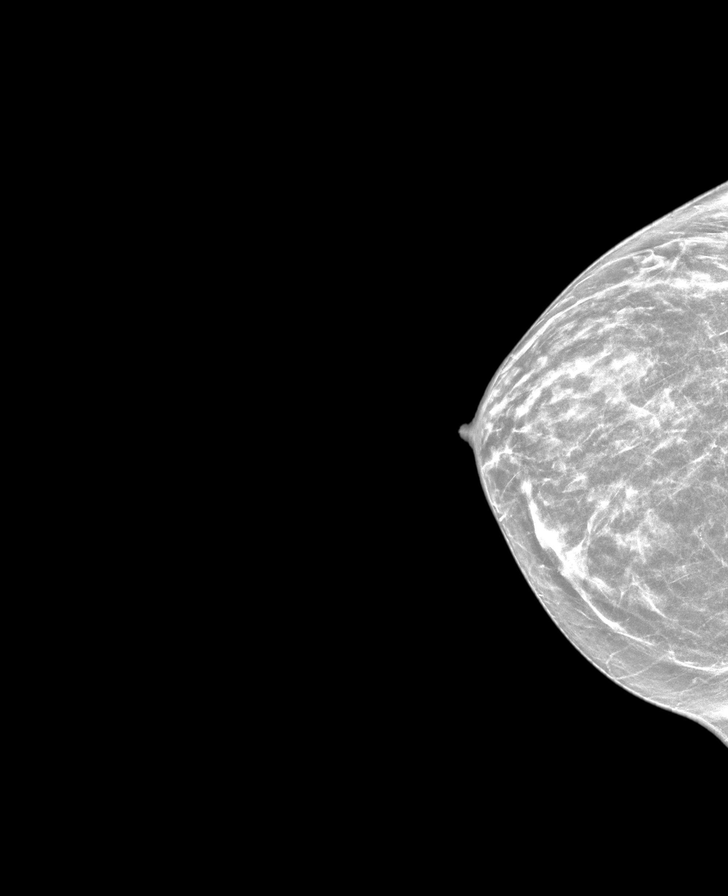

[L MLO synth-2D]
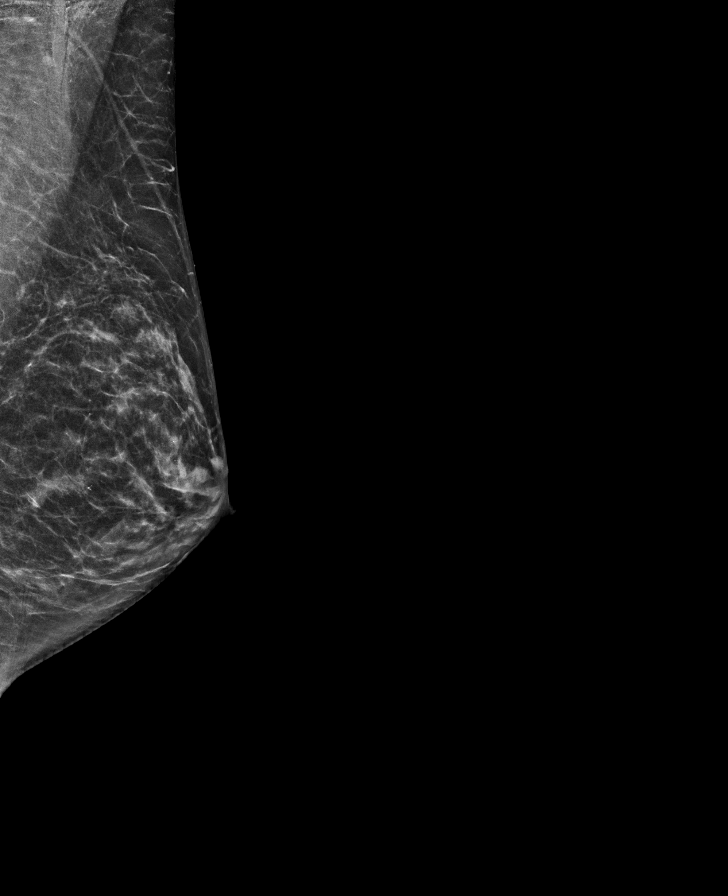

[R MLO tomo · tomo slice 23/45.0]
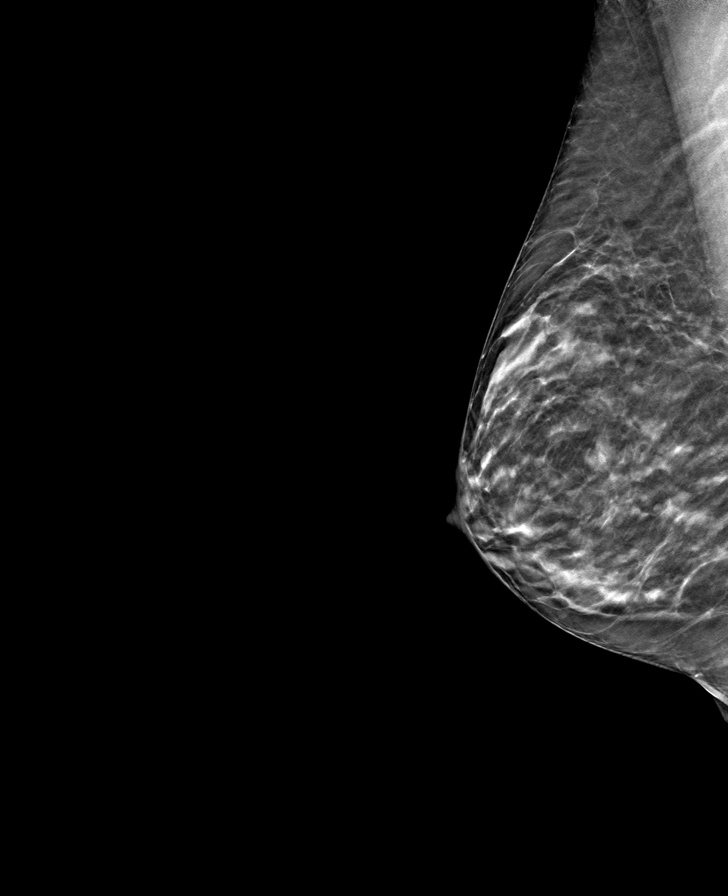

[R CC tomo · tomo slice 27/52.0]
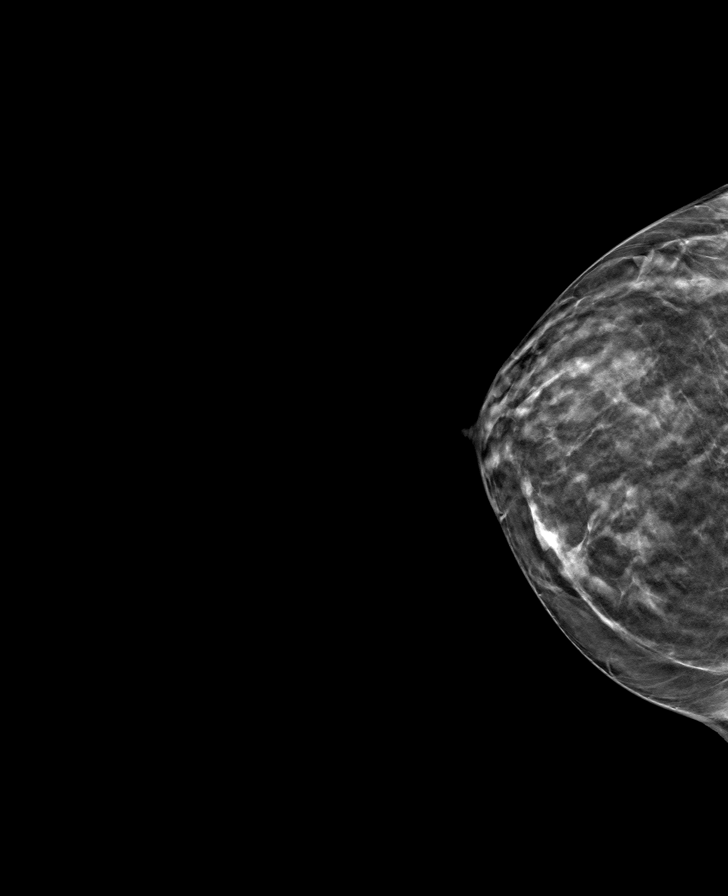

[L MLO tomo · tomo slice 31/61.0]
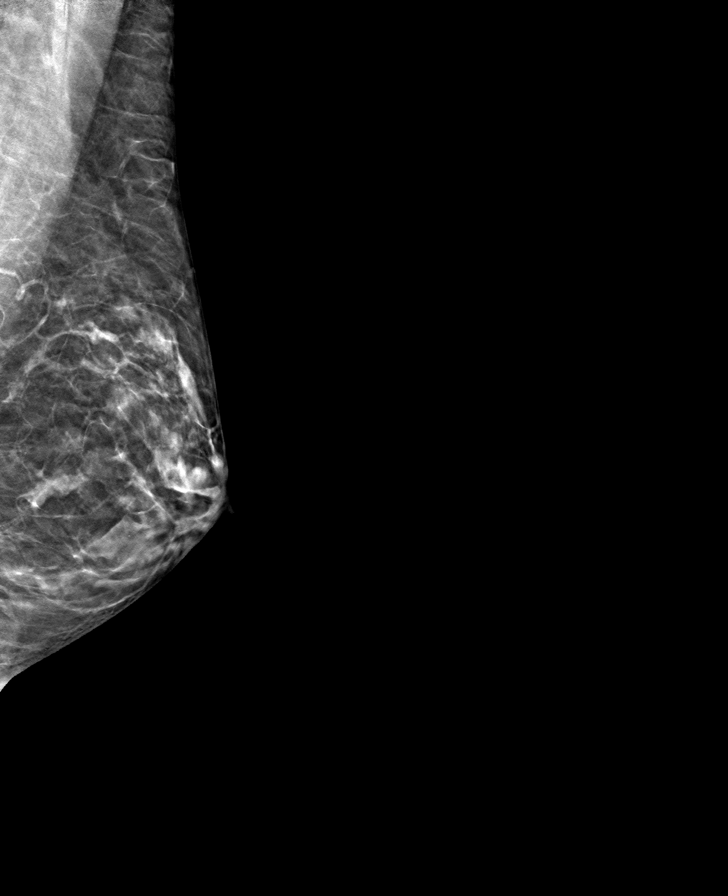

[L CC tomo · tomo slice 27/54.0]
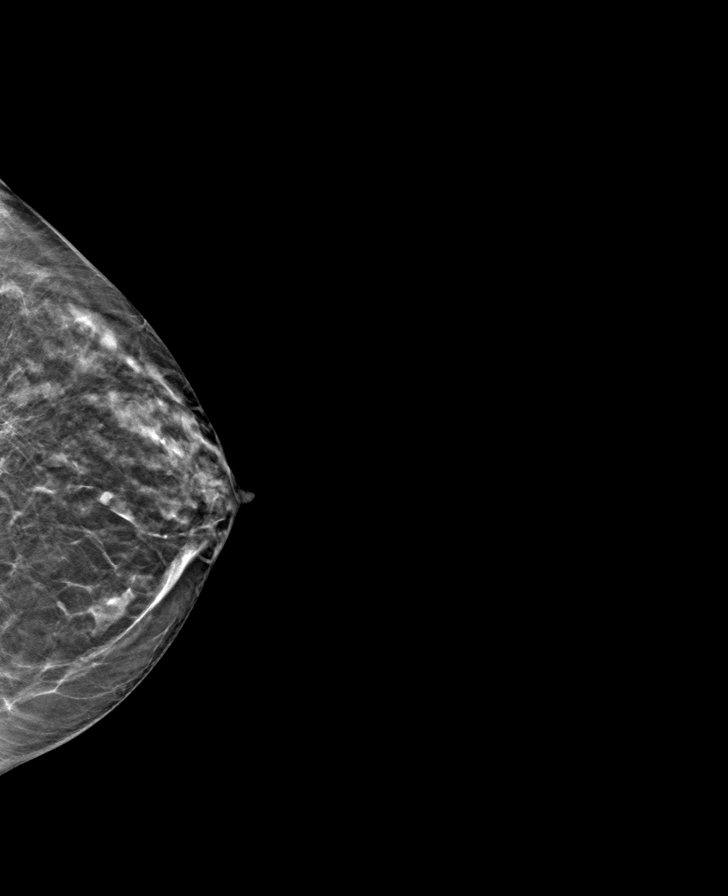

[8 of 24 positions shown; findings below may reference images not displayed]

ACR Breast Density Category b: There are scattered areas of
fibroglandular density.
FINDINGS: There are no findings suspicious for malignancy. Images were
processed with CAD.
IMPRESSION: No mammographic evidence of malignancy. A result letter of this
screening mammogram will be mailed directly to the patient.

RECOMMENDATION:
Screening mammogram in one year. (Code:[id number])

BI-RADS CATEGORY  1: Negative.

## 2020-01-10 NOTE — Progress Notes (Signed)
  Subjective:     Patient ID: Lindsay Potts, female   DOB: April 20, 1966, 54 y.o.   MRN: [id number]  HPI   Review of Systems     Objective:   Physical Exam Chest:     Breasts:        Right: No swelling, bleeding, inverted nipple, mass, nipple discharge, skin change or tenderness.        Left: No swelling, bleeding, inverted nipple, mass, nipple discharge, skin change or tenderness.        Assessment:     54 year old patient presents for [facility] clinic visit.  Patient screened, and meets [facility] eligibility.  Patient does not have insurance, Medicare or Medicaid. Instructed patient on breast self awareness using teach back method.  Clinical breast exam unremarkable. No mass or lump palpated.   Risk Assessment    Risk Scores      01/10/2020   Last edited by: Alta Corning, CMA   5-year risk: 1.2 %   Lifetime risk: 9.4 %            Plan:     Sent for bilateral screening mammogram.

## 2020-01-29 NOTE — Progress Notes (Signed)
Letter mailed from [facility] to notify of normal mammogram results.  Patient to return in one year for annual screening.  Copy to [facility]. 

## 2021-04-14 ENCOUNTER — Other Ambulatory Visit: Payer: Self-pay | Admitting: Physician Assistant

## 2021-04-14 DIAGNOSIS — Z1231 Encounter for screening mammogram for malignant neoplasm of breast: Secondary | ICD-10-CM

## 2023-01-20 ENCOUNTER — Ambulatory Visit
Admission: EM | Admit: 2023-01-20 | Discharge: 2023-01-20 | Disposition: A | Discharge status: Home / Self Care | Payer: No Typology Code available for payment source | Attending: Internal Medicine | Admitting: Internal Medicine

## 2023-01-20 ENCOUNTER — Encounter: Payer: Self-pay | Admitting: Emergency Medicine

## 2023-01-20 DIAGNOSIS — J209 Acute bronchitis, unspecified: Secondary | ICD-10-CM | POA: Insufficient documentation

## 2023-01-20 DIAGNOSIS — N3001 Acute cystitis with hematuria: Secondary | ICD-10-CM | POA: Diagnosis not present

## 2023-01-20 LAB — URINALYSIS, W/ REFLEX TO CULTURE (INFECTION SUSPECTED)
Glucose, UA: 100 mg/dL — AB
Hgb urine dipstick: NEGATIVE
Ketones, ur: 15 mg/dL — AB
Nitrite: POSITIVE — AB
Protein, ur: 30 mg/dL — AB
Specific Gravity, Urine: 1.02 (ref 1.005–1.030)
pH: 5.5 (ref 5.0–8.0)

## 2023-01-20 MED ORDER — METHYLPREDNISOLONE 4 MG PO TBPK: ORAL_TABLET | ORAL | 0 refills | Status: AC

## 2023-01-20 MED ORDER — ALBUTEROL SULFATE HFA 108 (90 BASE) MCG/ACT IN AERS: 2.0000 | INHALATION_SPRAY | RESPIRATORY_TRACT | 0 refills | Status: AC | PRN

## 2023-01-20 MED ORDER — ALBUTEROL SULFATE (2.5 MG/3ML) 0.083% IN NEBU: 2.5000 mg | INHALATION_SOLUTION | Freq: Once | RESPIRATORY_TRACT | Status: DC

## 2023-01-20 MED ORDER — LEVOFLOXACIN 500 MG PO TABS: 500.0000 mg | ORAL_TABLET | Freq: Every day | ORAL | 0 refills | Status: AC

## 2023-01-20 MED ORDER — PSEUDOEPHEDRINE HCL ER 120 MG PO TB12: 120.0000 mg | ORAL_TABLET | Freq: Two times a day (BID) | ORAL | 0 refills | Status: AC

## 2023-01-20 MED ORDER — IPRATROPIUM-ALBUTEROL 0.5-2.5 (3) MG/3ML IN SOLN
3.0000 mL | Freq: Once | RESPIRATORY_TRACT | Status: AC
  Administered 2023-01-20: 3 mL via RESPIRATORY_TRACT

## 2023-01-20 NOTE — Discharge Instructions (Addendum)
If your cough does not resolve by the time you finish this medications, please make sure you follow up with your primary care doctor since you will need a chest xray. N

## 2023-01-20 NOTE — ED Provider Notes (Signed)
[facility]    CSN: [id number] Arrival date & time: 01/20/23  1709      History   Chief Complaint Chief Complaint  Patient presents with   Cough   Nasal Congestion   Sinus pressure    Dysuria    HPI Lindsay Potts is a 57 y.o. female who presents with onset of URI symptoms 10 days ago, and is not getting better with OTC meds. Her cough is at times productive with green mucous. Has not had a fever that she knows of. She is a smoker.  2- Has been having dysuria x 2 days and been taking Azo  History reviewed. No pertinent past medical history.  There are no problems to display for this patient.   Past Surgical History:  Procedure Laterality Date   BILATERAL CARPAL TUNNEL RELEASE Bilateral     OB History   No obstetric history on file.      Home Medications    Prior to Admission medications   Not on File    Family History Family History  Problem Relation Age of Onset   Breast cancer Neg Hx     Social History Social History   Tobacco Use   Smoking status: Every Day    Types: Cigarettes   Smokeless tobacco: Never  Vaping Use   Vaping status: Never Used  Substance Use Topics   Alcohol use: Yes    Comment: social   Drug use: Yes    Types: Marijuana     Allergies   Codeine   Review of Systems Review of Systems  Constitutional:  Positive for fatigue and fever. Negative for appetite change, chills and diaphoresis.  HENT:  Positive for congestion, postnasal drip, sinus pressure and sinus pain. Negative for ear discharge, ear pain, rhinorrhea, sore throat, trouble swallowing and voice change.   Eyes:  Negative for discharge.  Respiratory:  Positive for cough and wheezing. Negative for chest tightness and shortness of breath.   Cardiovascular:  Negative for chest pain.  Genitourinary:  Positive for dysuria, frequency and urgency.  Hematological:  Negative for adenopathy.     Physical Exam Triage Vital Signs ED Triage Vitals   Encounter Vitals Group     BP 01/20/23 1725 138/80     Systolic BP Percentile --      Diastolic BP Percentile --      Pulse Rate 01/20/23 1725 74     Resp 01/20/23 1725 16     Temp 01/20/23 1725 97.7 F (36.5 C)     Temp Source 01/20/23 1725 Oral     SpO2 01/20/23 1725 96 %     Weight --      Height --      Head Circumference --      Peak Flow --      Pain Score 01/20/23 1723 0     Pain Loc --      Pain Education --      Exclude from Growth Chart --    No data found.  Updated Vital Signs BP 138/80 (BP Location: Right Arm)   Pulse 74   Temp 97.7 F (36.5 C) (Oral)   Resp 16   SpO2 96%   Visual Acuity Right Eye Distance:   Left Eye Distance:   Bilateral Distance:    Right Eye Near:   Left Eye Near:    Bilateral Near:     Physical Exam Abdominal:     Tenderness: There is no right CVA tenderness  or left CVA tenderness.    Physical Exam Constitutional:      General: He is not in acute distress.    Appearance: He is not toxic-appearing.  HENT:     Head: Normocephalic.     Right Ear: Tympanic membrane, ear canal and external ear normal.     Left Ear: Ear canal and external ear normal.     Nose: Nose normal.     Mouth/Throat:     Mouth: Mucous membranes are moist.     Pharynx: Oropharynx is clear.  Eyes:     General: No scleral icterus.    Conjunctiva/sclera: Conjunctivae normal.  Cardiovascular:     Rate and Rhythm: Normal rate and regular rhythm.     Heart sounds: No murmur heard.   Pulmonary:     Effort: Pulmonary effort is normal. No respiratory distress.     Breath sounds: clear, but deep breathing provoked cough attacks.      Comments: Has auditory wheezing Musculoskeletal:        General: Normal range of motion.     Cervical back: Neck supple.  Lymphadenopathy:     Cervical: No cervical adenopathy.  Skin:    General: Skin is warm and dry.     Findings: No rash.  Neurological:     Mental Status: He is alert and oriented to person, place, and  time.     Gait: Gait normal.  Psychiatric:        Mood and Affect: Mood normal.        Behavior: Behavior normal.        Thought Content: Thought content normal.        Judgment: Judgment normal.    UC Treatments / Results  Labs (all labs ordered are listed, but only abnormal results are displayed) Labs Reviewed  URINALYSIS, W/ REFLEX TO CULTURE (INFECTION SUSPECTED) - Abnormal; Notable for the following components:      Result Value   Color, Urine ORANGE (*)    APPearance HAZY (*)    Glucose, UA 100 (*)    Bilirubin Urine SMALL (*)    Ketones, ur 15 (*)    Protein, ur 30 (*)    Nitrite POSITIVE (*)    Leukocytes,Ua TRACE (*)    Bacteria, UA MANY (*)    All other components within normal limits  URINE CULTURE    EKG   Radiology No results found.  Procedures Procedures (including critical care time)  Medications Ordered in UC Medications  ipratropium-albuterol (DUONEB) 0.5-2.5 (3) MG/3ML nebulizer solution 3 mL (has no administration in time range)    Initial Impression / Assessment and Plan / UC Course  I have reviewed the triage vital signs and the nursing notes. She was given duoneb treatment and her cough attacks calmed down. Pulse ox went up to 96% Pertinent labs  results that were available during my care of the patient were reviewed by me and considered in my medical decision making (see chart for details).  UTI Acute bronchitis  I placed her on Levaquin, Albuterol inhaler, Sudafed and Medrol as noted. See instructions.    Final Clinical Impressions(s) / UC Diagnoses   Final diagnoses:  None   Discharge Instructions   None    ED Prescriptions   None    PDMP not reviewed this encounter.   Garey Ham, [place] 01/20/23 1820

## 2023-01-20 NOTE — ED Triage Notes (Signed)
Pt presents with a cough, nasal congestion and sinus pressure x 10 months. Pt has not been seen for her symptoms and has been taking OTC medication for relief.   She also has dysuria x 2 days. She started taking AZO to help with discomfort.

## 2023-01-27 ENCOUNTER — Telehealth (HOSPITAL_COMMUNITY): Payer: Self-pay | Admitting: Emergency Medicine

## 2023-01-27 MED ORDER — CEFDINIR 300 MG PO CAPS: 300.0000 mg | ORAL_CAPSULE | Freq: Two times a day (BID) | ORAL | 0 refills | Status: AC

## 2024-07-12 ENCOUNTER — Telehealth: Payer: Self-pay

## 2024-07-12 NOTE — Telephone Encounter (Signed)
 07/12/24 Referral sent to Ileana Lever, [facility], Hep C Bridge Counselor. [facility]EDSS # [id number]

## 2024-07-12 NOTE — Telephone Encounter (Signed)
 Phone call from pt to [facility], transferred to CD. 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 -Discussed Hep C Paramedic. Pt does want referral sent to [facility]. -Discussed f/u with dr to evaluate liver function/status. States she can reach out to [facility]; she has seen them in the past. -Discussed Hep A/B immunizations if she has not had these already. Pt does not believe she has had these immunizations. Counseled that immunizations can be completed at [facility].  Her cell phone is not currently working. The best number to reach her at is [phone].
